# Patient Record
Sex: Male | Born: 1950 | Race: Black or African American | Hispanic: No | Marital: Married | Smoking: Former smoker
Health system: Southern US, Community
[De-identification: ages and names within clinical notes are randomized; demographics above are authoritative.]

## PROBLEM LIST (undated history)

## (undated) DIAGNOSIS — I2510 Atherosclerotic heart disease of native coronary artery without angina pectoris: Secondary | ICD-10-CM

## (undated) DIAGNOSIS — J45909 Unspecified asthma, uncomplicated: Secondary | ICD-10-CM

## (undated) DIAGNOSIS — I10 Essential (primary) hypertension: Secondary | ICD-10-CM

## (undated) DIAGNOSIS — C679 Malignant neoplasm of bladder, unspecified: Secondary | ICD-10-CM

## (undated) HISTORY — PX: FOOT SURGERY: SHX648

## (undated) HISTORY — PX: CARDIAC CATHETERIZATION: SHX172

## (undated) HISTORY — PX: BACK SURGERY: SHX140

## (undated) HISTORY — DX: Malignant neoplasm of bladder, unspecified: C67.9

## (undated) HISTORY — PX: TONSILLECTOMY: SUR1361

## (undated) HISTORY — PX: WRIST FRACTURE SURGERY: SHX121

---

## 1998-09-21 ENCOUNTER — Encounter: Payer: Self-pay | Admitting: Nephrology

## 1998-09-21 ENCOUNTER — Ambulatory Visit (HOSPITAL_COMMUNITY): Admission: RE | Admit: 1998-09-21 | Discharge: 1998-09-21 | Payer: Self-pay | Admitting: Nephrology

## 2003-07-11 ENCOUNTER — Encounter: Admission: RE | Admit: 2003-07-11 | Discharge: 2003-07-11 | Payer: Self-pay | Admitting: Allergy and Immunology

## 2005-02-18 IMAGING — CR DG CHEST 2V
2 series · 2 of 2 positions shown · non-contrast
Comparison: none

CLINICAL DATA: Cough, smoking history.  
 CHEST X-RAY
 Two views of the chest show the lungs to be clear.   The heart is within upper limits of normal.  No acute bony abnormality is seen. 
 IMPRESSION
 No active lung disease.

[view not recorded (1 of 2)]
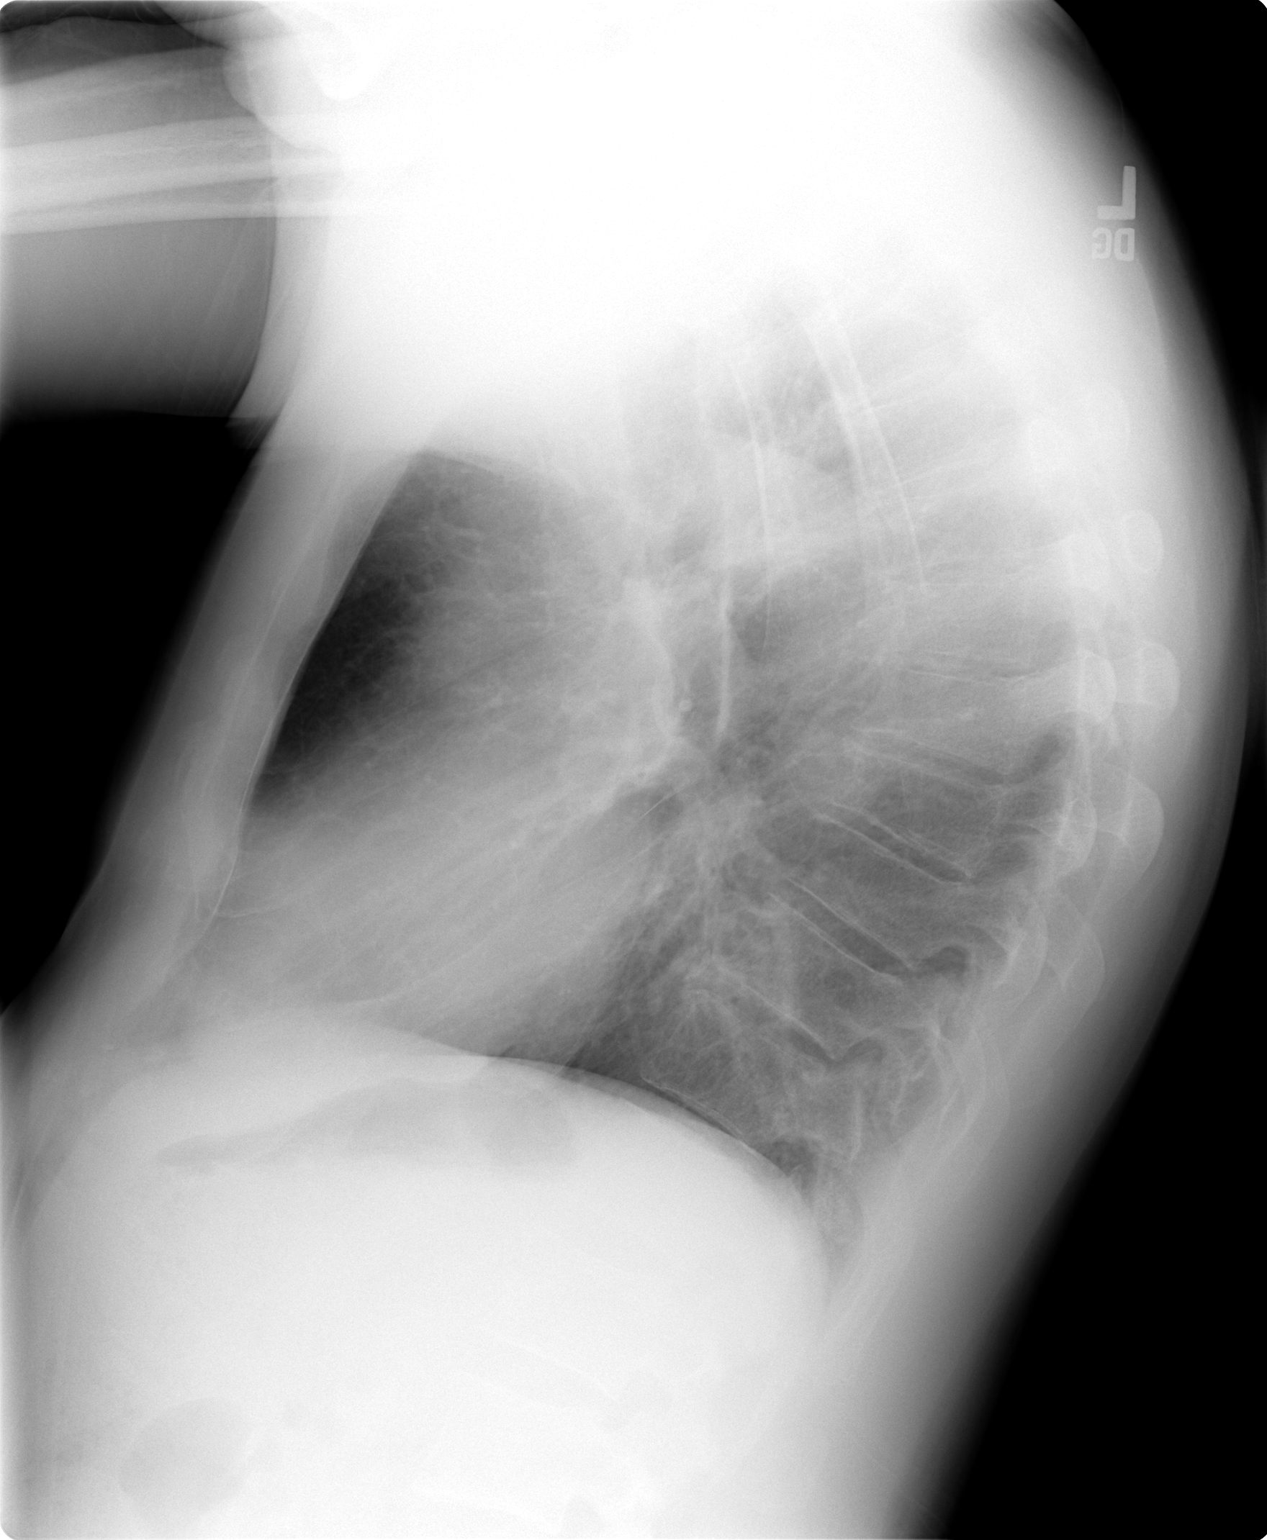

[view not recorded (2 of 2)]
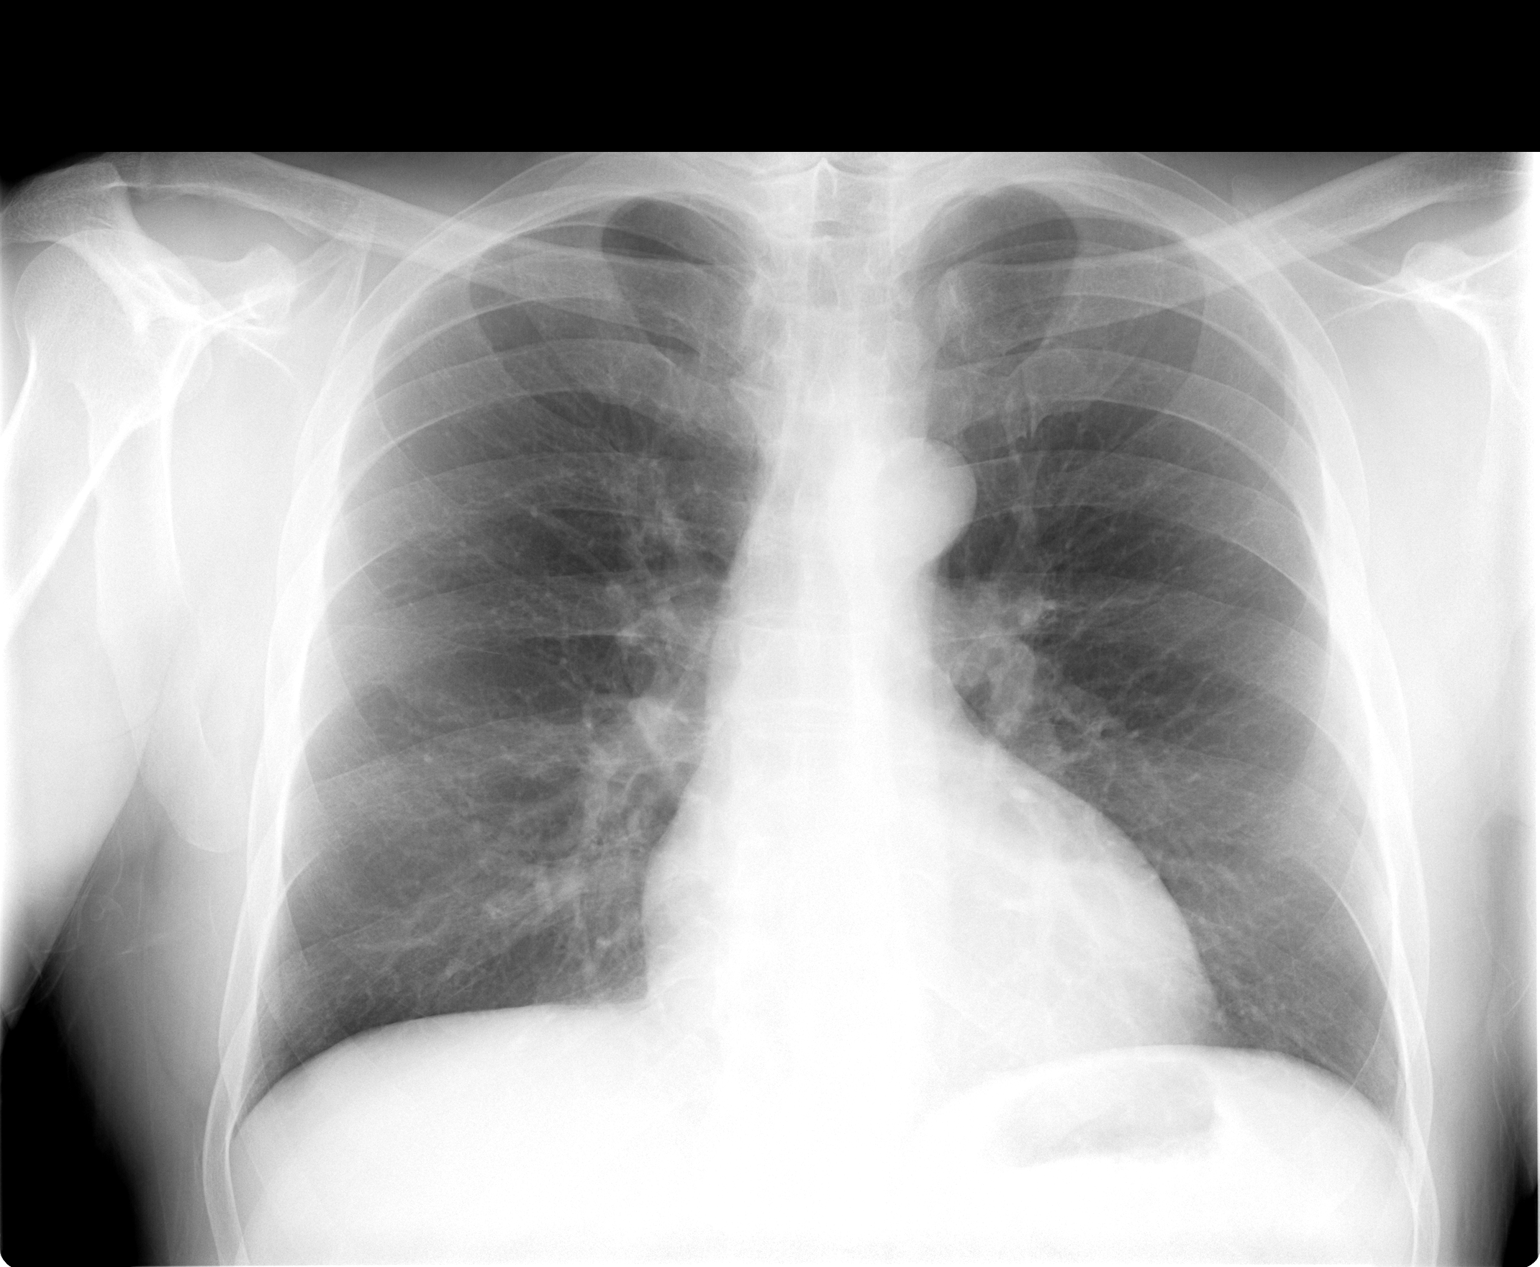

[2 of 2 positions shown; findings below may reference images not displayed]

## 2005-07-28 ENCOUNTER — Emergency Department (HOSPITAL_COMMUNITY): Admission: EM | Admit: 2005-07-28 | Discharge: 2005-07-28 | Payer: Self-pay | Admitting: Emergency Medicine

## 2007-07-30 ENCOUNTER — Emergency Department (HOSPITAL_COMMUNITY): Admission: EM | Admit: 2007-07-30 | Discharge: 2007-07-30 | Payer: Self-pay | Admitting: Emergency Medicine

## 2011-02-25 LAB — CBC
HCT: 44.8
Hemoglobin: 15.2
MCHC: 34
MCV: 85.8
Platelets: 230
RBC: 5.22
RDW: 12.7
WBC: 8.6

## 2011-02-25 LAB — URINALYSIS, ROUTINE W REFLEX MICROSCOPIC
Bilirubin Urine: NEGATIVE
Glucose, UA: NEGATIVE
Specific Gravity, Urine: 1.034 — ABNORMAL HIGH
pH: 5.5

## 2011-02-25 LAB — I-STAT 8, (EC8 V) (CONVERTED LAB)
Acid-Base Excess: 1
Bicarbonate: 26.9 — ABNORMAL HIGH
Glucose, Bld: 114 — ABNORMAL HIGH
Hemoglobin: 16.3
Operator id: 277751
Sodium: 138
TCO2: 28

## 2011-02-25 LAB — URINE MICROSCOPIC-ADD ON

## 2011-02-25 LAB — DIFFERENTIAL
Eosinophils Relative: 0
Lymphocytes Relative: 16
Monocytes Absolute: 0.6
Monocytes Relative: 7
Neutro Abs: 6.6

## 2014-05-06 ENCOUNTER — Emergency Department (HOSPITAL_COMMUNITY): Payer: Non-veteran care

## 2014-05-06 ENCOUNTER — Inpatient Hospital Stay (HOSPITAL_COMMUNITY)
Admission: EM | Admit: 2014-05-06 | Discharge: 2014-05-08 | DRG: 202 | Disposition: A | Discharge status: Home / Self Care | Payer: Non-veteran care | Attending: Family Medicine | Admitting: Family Medicine

## 2014-05-06 ENCOUNTER — Encounter (HOSPITAL_COMMUNITY): Payer: Self-pay | Admitting: Emergency Medicine

## 2014-05-06 DIAGNOSIS — J4540 Moderate persistent asthma, uncomplicated: Secondary | ICD-10-CM | POA: Diagnosis present

## 2014-05-06 DIAGNOSIS — R0609 Other forms of dyspnea: Secondary | ICD-10-CM | POA: Diagnosis present

## 2014-05-06 DIAGNOSIS — Z720 Tobacco use: Secondary | ICD-10-CM

## 2014-05-06 DIAGNOSIS — Z79899 Other long term (current) drug therapy: Secondary | ICD-10-CM

## 2014-05-06 DIAGNOSIS — I10 Essential (primary) hypertension: Secondary | ICD-10-CM | POA: Diagnosis present

## 2014-05-06 DIAGNOSIS — Z8249 Family history of ischemic heart disease and other diseases of the circulatory system: Secondary | ICD-10-CM

## 2014-05-06 DIAGNOSIS — I369 Nonrheumatic tricuspid valve disorder, unspecified: Secondary | ICD-10-CM

## 2014-05-06 DIAGNOSIS — R0602 Shortness of breath: Secondary | ICD-10-CM | POA: Diagnosis present

## 2014-05-06 DIAGNOSIS — I5033 Acute on chronic diastolic (congestive) heart failure: Secondary | ICD-10-CM | POA: Diagnosis present

## 2014-05-06 DIAGNOSIS — I5032 Chronic diastolic (congestive) heart failure: Secondary | ICD-10-CM

## 2014-05-06 DIAGNOSIS — R0600 Dyspnea, unspecified: Secondary | ICD-10-CM | POA: Diagnosis present

## 2014-05-06 DIAGNOSIS — J45901 Unspecified asthma with (acute) exacerbation: Principal | ICD-10-CM

## 2014-05-06 DIAGNOSIS — J209 Acute bronchitis, unspecified: Secondary | ICD-10-CM | POA: Diagnosis present

## 2014-05-06 DIAGNOSIS — R9431 Abnormal electrocardiogram [ECG] [EKG]: Secondary | ICD-10-CM | POA: Diagnosis present

## 2014-05-06 HISTORY — DX: Unspecified asthma, uncomplicated: J45.909

## 2014-05-06 LAB — CBC WITH DIFFERENTIAL/PLATELET
BASOS PCT: 1 % (ref 0–1)
Basophils Absolute: 0 10*3/uL (ref 0.0–0.1)
EOS ABS: 0 10*3/uL (ref 0.0–0.7)
Eosinophils Relative: 1 % (ref 0–5)
HCT: 45.2 % (ref 39.0–52.0)
HEMOGLOBIN: 15.5 g/dL (ref 13.0–17.0)
Lymphocytes Relative: 31 % (ref 12–46)
Lymphs Abs: 1.5 10*3/uL (ref 0.7–4.0)
MCH: 30.8 pg (ref 26.0–34.0)
MCHC: 34.3 g/dL (ref 30.0–36.0)
MCV: 89.7 fL (ref 78.0–100.0)
MONOS PCT: 11 % (ref 3–12)
Monocytes Absolute: 0.6 10*3/uL (ref 0.1–1.0)
NEUTROS ABS: 2.8 10*3/uL (ref 1.7–7.7)
NEUTROS PCT: 56 % (ref 43–77)
PLATELETS: 165 10*3/uL (ref 150–400)
RBC: 5.04 MIL/uL (ref 4.22–5.81)
RDW: 12.3 % (ref 11.5–15.5)
WBC: 5 10*3/uL (ref 4.0–10.5)

## 2014-05-06 LAB — BASIC METABOLIC PANEL
ANION GAP: 13 (ref 5–15)
BUN: 8 mg/dL (ref 6–23)
CHLORIDE: 100 meq/L (ref 96–112)
CO2: 24 mEq/L (ref 19–32)
Calcium: 9.5 mg/dL (ref 8.4–10.5)
Creatinine, Ser: 0.89 mg/dL (ref 0.50–1.35)
GFR, EST NON AFRICAN AMERICAN: 88 mL/min — AB (ref >90)
Glucose, Bld: 124 mg/dL — ABNORMAL HIGH (ref 70–99)
POTASSIUM: 3.9 meq/L (ref 3.7–5.3)
SODIUM: 137 meq/L (ref 137–147)

## 2014-05-06 LAB — CBC
HEMATOCRIT: 45 % (ref 39.0–52.0)
Hemoglobin: 15.5 g/dL (ref 13.0–17.0)
MCH: 30.2 pg (ref 26.0–34.0)
MCHC: 34.4 g/dL (ref 30.0–36.0)
MCV: 87.7 fL (ref 78.0–100.0)
PLATELETS: 172 10*3/uL (ref 150–400)
RBC: 5.13 MIL/uL (ref 4.22–5.81)
RDW: 12.1 % (ref 11.5–15.5)
WBC: 4.7 10*3/uL (ref 4.0–10.5)

## 2014-05-06 LAB — TROPONIN I: Troponin I: <0.3 ng/mL (ref <0.30)

## 2014-05-06 LAB — PRO B NATRIURETIC PEPTIDE: PRO B NATRI PEPTIDE: 51.2 pg/mL (ref 0–125)

## 2014-05-06 MED ORDER — IPRATROPIUM-ALBUTEROL 0.5-2.5 (3) MG/3ML IN SOLN
3.0000 mL | Freq: Three times a day (TID) | RESPIRATORY_TRACT | Status: DC
  Administered 2014-05-07: 3 mL via RESPIRATORY_TRACT
  Filled 2014-05-06: qty 3

## 2014-05-06 MED ORDER — HYDROCHLOROTHIAZIDE 25 MG PO TABS
25.0000 mg | ORAL_TABLET | Freq: Every day | ORAL | Status: DC
  Filled 2014-05-06: qty 1

## 2014-05-06 MED ORDER — HYDROCHLOROTHIAZIDE 12.5 MG PO CAPS
12.5000 mg | ORAL_CAPSULE | Freq: Once | ORAL | Status: AC
  Administered 2014-05-06: 12.5 mg via ORAL
  Filled 2014-05-06: qty 1

## 2014-05-06 MED ORDER — GUAIFENESIN ER 600 MG PO TB12
600.0000 mg | ORAL_TABLET | Freq: Two times a day (BID) | ORAL | Status: DC
  Administered 2014-05-06 (×2): 600 mg via ORAL
  Filled 2014-05-06 (×3): qty 1

## 2014-05-06 MED ORDER — ALBUTEROL SULFATE (2.5 MG/3ML) 0.083% IN NEBU: 2.5000 mg | INHALATION_SOLUTION | Freq: Four times a day (QID) | RESPIRATORY_TRACT | Status: DC

## 2014-05-06 MED ORDER — ONDANSETRON HCL 4 MG/2ML IJ SOLN: 4.0000 mg | Freq: Four times a day (QID) | INTRAMUSCULAR | Status: DC | PRN

## 2014-05-06 MED ORDER — ADULT MULTIVITAMIN W/MINERALS CH
1.0000 | ORAL_TABLET | Freq: Every day | ORAL | Status: DC
  Administered 2014-05-06 – 2014-05-08 (×3): 1 via ORAL
  Filled 2014-05-06 (×3): qty 1

## 2014-05-06 MED ORDER — SODIUM CHLORIDE 0.9 % IJ SOLN
3.0000 mL | Freq: Two times a day (BID) | INTRAMUSCULAR | Status: DC
  Administered 2014-05-06 – 2014-05-08 (×5): 3 mL via INTRAVENOUS

## 2014-05-06 MED ORDER — ONDANSETRON HCL 4 MG PO TABS: 4.0000 mg | ORAL_TABLET | Freq: Four times a day (QID) | ORAL | Status: DC | PRN

## 2014-05-06 MED ORDER — ALBUTEROL SULFATE (2.5 MG/3ML) 0.083% IN NEBU
2.5000 mg | INHALATION_SOLUTION | RESPIRATORY_TRACT | Status: DC | PRN
  Administered 2014-05-06: 2.5 mg via RESPIRATORY_TRACT
  Filled 2014-05-06: qty 3

## 2014-05-06 MED ORDER — ALIVE MENS ENERGY PO TABS: 1.0000 | ORAL_TABLET | Freq: Every day | ORAL | Status: DC

## 2014-05-06 MED ORDER — SODIUM CHLORIDE 0.9 % IV SOLN: 250.0000 mL | INTRAVENOUS | Status: DC | PRN

## 2014-05-06 MED ORDER — ACETAMINOPHEN 650 MG RE SUPP: 650.0000 mg | Freq: Four times a day (QID) | RECTAL | Status: DC | PRN

## 2014-05-06 MED ORDER — IPRATROPIUM BROMIDE 0.02 % IN SOLN: 0.5000 mg | Freq: Four times a day (QID) | RESPIRATORY_TRACT | Status: DC

## 2014-05-06 MED ORDER — ZOLPIDEM TARTRATE 5 MG PO TABS
5.0000 mg | ORAL_TABLET | Freq: Once | ORAL | Status: AC
  Administered 2014-05-06: 5 mg via ORAL
  Filled 2014-05-06: qty 1

## 2014-05-06 MED ORDER — SODIUM CHLORIDE 0.9 % IJ SOLN: 3.0000 mL | INTRAMUSCULAR | Status: DC | PRN

## 2014-05-06 MED ORDER — METOPROLOL TARTRATE 25 MG PO TABS
50.0000 mg | ORAL_TABLET | Freq: Once | ORAL | Status: AC
  Administered 2014-05-06: 50 mg via ORAL
  Filled 2014-05-06: qty 2

## 2014-05-06 MED ORDER — VITAMIN D3 25 MCG (1000 UNIT) PO TABS
1000.0000 [IU] | ORAL_TABLET | Freq: Every day | ORAL | Status: DC
  Administered 2014-05-06 – 2014-05-08 (×3): 1000 [IU] via ORAL
  Filled 2014-05-06 (×3): qty 1

## 2014-05-06 MED ORDER — METHYLPREDNISOLONE SODIUM SUCC 125 MG IJ SOLR
60.0000 mg | Freq: Two times a day (BID) | INTRAMUSCULAR | Status: DC
  Administered 2014-05-06 (×2): 60 mg via INTRAVENOUS
  Filled 2014-05-06 (×3): qty 0.96

## 2014-05-06 MED ORDER — ONDANSETRON HCL 4 MG/2ML IJ SOLN: 4.0000 mg | Freq: Three times a day (TID) | INTRAMUSCULAR | Status: DC | PRN

## 2014-05-06 MED ORDER — ACETAMINOPHEN 325 MG PO TABS: 650.0000 mg | ORAL_TABLET | Freq: Four times a day (QID) | ORAL | Status: DC | PRN

## 2014-05-06 MED ORDER — ASPIRIN EC 81 MG PO TBEC
81.0000 mg | DELAYED_RELEASE_TABLET | Freq: Every day | ORAL | Status: DC
  Administered 2014-05-06 – 2014-05-08 (×3): 81 mg via ORAL
  Filled 2014-05-06 (×3): qty 1

## 2014-05-06 MED ORDER — IPRATROPIUM-ALBUTEROL 0.5-2.5 (3) MG/3ML IN SOLN
3.0000 mL | Freq: Four times a day (QID) | RESPIRATORY_TRACT | Status: DC
  Administered 2014-05-06 (×2): 3 mL via RESPIRATORY_TRACT
  Filled 2014-05-06 (×2): qty 3

## 2014-05-06 MED ORDER — HYDROCODONE-ACETAMINOPHEN 5-325 MG PO TABS: 1.0000 | ORAL_TABLET | ORAL | Status: DC | PRN

## 2014-05-06 MED ORDER — ENOXAPARIN SODIUM 40 MG/0.4ML ~~LOC~~ SOLN
40.0000 mg | SUBCUTANEOUS | Status: DC
  Administered 2014-05-06 – 2014-05-07 (×2): 40 mg via SUBCUTANEOUS
  Filled 2014-05-06 (×3): qty 0.4

## 2014-05-06 NOTE — ED Provider Notes (Signed)
CSN: [id number]     Arrival date & time 05/06/14  0704 History   First MD Initiated Contact with Patient 05/06/14 [phone]     Chief Complaint  Patient presents with  . Shortness of Breath      HPI Pt states he has been having shortness of breath for the past few days that has progressively getting worse Pt states it is worse with exertion and when he lays flat Pt states he woke up this morning at 2 am and has not been able to go back to sleep  Past Medical History  Diagnosis Date  . Asthma    Past Surgical History  Procedure Laterality Date  . Back surgery     Family History  Problem Relation Age of Onset  . Diabetes Other   . Hypertension Other   . CAD Other    History  Substance Use Topics  . Smoking status: Light Tobacco Smoker    Types: Cigars  . Smokeless tobacco: Not on file  . Alcohol Use: Yes     Comment: socially    Review of Systems  All other systems reviewed and are negative.     Allergies  Review of patient's allergies indicates no known allergies.  Home Medications   Prior to Admission medications   Medication Sig Start Date End Date Taking? Authorizing Provider  albuterol (PROVENTIL HFA;VENTOLIN HFA) 108 (90 BASE) MCG/ACT inhaler Inhale 2 puffs into the lungs every 4 (four) hours as needed for wheezing or shortness of breath.   Yes Historical Provider, MD  cholecalciferol (VITAMIN D) 1000 UNITS tablet Take 1,000 Units by mouth daily.   Yes Historical Provider, MD  meloxicam (MOBIC) 7.5 MG tablet Take 7.5 mg by mouth daily as needed for pain.   Yes Historical Provider, MD  Multiple Vitamins-Minerals (ALIVE MENS ENERGY) TABS Take 1 tablet by mouth daily.   Yes Historical Provider, MD  guaiFENesin (MUCINEX) 600 MG 12 hr tablet Take 2 tablets (1,200 mg total) by mouth 2 (two) times daily. 05/08/14   Oswald Hillock, MD  hydrochlorothiazide (MICROZIDE) 12.5 MG capsule Take 1 capsule (12.5 mg total) by mouth daily. 05/08/14   Oswald Hillock, MD  levofloxacin  (LEVAQUIN) 500 MG tablet Take 1 tablet (500 mg total) by mouth daily. 05/08/14   Oswald Hillock, MD  metoprolol (LOPRESSOR) 50 MG tablet Take 1 tablet (50 mg total) by mouth 2 (two) times daily. 05/08/14   Oswald Hillock, MD  predniSONE (DELTASONE) 10 MG tablet Prednisone 40 mg po daily x 1 day then Prednisone 30 mg po daily x 1 day then Prednisone 20 mg po daily x 1 day then Prednisone 10 mg daily x 1 day then stop... 05/08/14   Oswald Hillock, MD   BP 136/80 mmHg  Pulse 63  Temp(Src) 97.9 F (36.6 C) (Oral)  Resp 20  Ht 6' (1.829 m)  Wt 194 lb 14.2 oz (88.4 kg)  BMI 26.43 kg/m2  SpO2 94% Physical Exam  Constitutional: He is oriented to person, place, and time. He appears well-developed and well-nourished. No distress.  HENT:  Head: Normocephalic and atraumatic.  Eyes: Pupils are equal, round, and reactive to light.  Neck: Normal range of motion.  Cardiovascular: Normal rate and intact distal pulses.   Pulmonary/Chest: He is in respiratory distress (Mild).  Abdominal: Normal appearance. He exhibits no distension.  Musculoskeletal: Normal range of motion.  Neurological: He is alert and oriented to person, place, and time. No cranial nerve deficit.  Skin: Skin is warm and dry. No rash noted.  Psychiatric: He has a normal mood and affect. His behavior is normal.  Nursing note and vitals reviewed.   ED Course  Procedures (including critical care time)  Medications  hydrochlorothiazide (MICROZIDE) capsule 12.5 mg (12.5 mg Oral Given 05/06/14 0929)  metoprolol tartrate (LOPRESSOR) tablet 50 mg (50 mg Oral Given 05/06/14 0928)  hydrochlorothiazide (MICROZIDE) capsule 12.5 mg (12.5 mg Oral Given 05/06/14 1121)  zolpidem (AMBIEN) tablet 5 mg (5 mg Oral Given 05/06/14 2055)  zolpidem (AMBIEN) tablet 5 mg (5 mg Oral Given 05/07/14 2236)    Labs Review Labs Reviewed  BASIC METABOLIC PANEL - Abnormal; Notable for the following:    Glucose, Bld 124 (*)    GFR calc non Af Amer 88 (*)    All other  components within normal limits  BASIC METABOLIC PANEL - Abnormal; Notable for the following:    Glucose, Bld 141 (*)    GFR calc non Af Amer 86 (*)    All other components within normal limits  BASIC METABOLIC PANEL - Abnormal; Notable for the following:    Glucose, Bld 137 (*)    BUN 25 (*)    GFR calc non Af Amer 73 (*)    GFR calc Af Amer 85 (*)    Anion gap 17 (*)    All other components within normal limits  CBC WITH DIFFERENTIAL  PRO B NATRIURETIC PEPTIDE  TROPONIN I  TROPONIN I  TROPONIN I  CBC  CBC    Imaging Review No results found.   EKG Interpretation   Date/Time:  Tuesday May 06 2014 07:21:00 EST Ventricular Rate:  74 PR Interval:  141 QRS Duration: 103 QT Interval:  393 QTC Calculation: 436 R Axis:   38 Text Interpretation:  Sinus rhythm Left atrial enlargement LVH with  secondary repolarization abnormality Anterior Q waves, possibly due to LVH  ST depr, consider ischemia, inferior leads No previous tracing Confirmed  by Yuleidy Rappleye  MD, [place] ([id number]) on 05/06/2014 7:24:29 AM      MDM   Final diagnoses:  SOB (shortness of breath)      Dot Lanes, MD 05/15/14 2155

## 2014-05-06 NOTE — Progress Notes (Signed)
Echocardiogram 2D Echocardiogram has been performed.  Alexander Burns 05/06/2014, 3:48 PM

## 2014-05-06 NOTE — Care Management Note (Addendum)
    Page 1 of 1   05/08/2014     4:09:48 PM CARE MANAGEMENT NOTE 05/08/2014  Patient:  KIANTE, CIAVARELLA   Account Number:  [id number]  Date Initiated:  05/06/2014  Documentation initiated by:  Dessa Phi  Subjective/Objective Assessment:   63 Y/O M ADMITTED W/SOB.     Action/Plan:   FROM HOME.   Anticipated DC Date:  05/08/2014   Anticipated DC Plan:  [place]  CM consult      Choice offered to / List presented to:             Status of service:  Completed, signed off Medicare Important Message given?   (If response is "NO", the following Medicare IM given date fields will be blank) Date Medicare IM given:   Medicare IM given by:   Date Additional Medicare IM given:   Additional Medicare IM given by:    Discharge Disposition:  HOME/SELF CARE  Per UR Regulation:  Reviewed for med. necessity/level of care/duration of stay  If discussed at [place] of Stay Meetings, dates discussed:    Comments:  05/08/14 Anyelin Mogle RN,BSN NCM [phone] D/C HOME NO [place].  05/06/14 Shlome Baldree RN,BSN NCM [place] D/C NEEDS.

## 2014-05-06 NOTE — H&P (Signed)
[facility] History and Physical  Lorene Samaan JQBHALP FXT:[id number] DOB: 08-Feb-1951 DOA: 05/06/2014  Referring physician: Dr Audie Pinto.  PCP: No primary care provider on file.   Chief Complaint: SOB  HPI: Alexander Burns is a 63 y.o. male with no PMH  Other than Asthma as a child, who presents with SOB , at rest and on exertion. He report dyspnea is worse at night, when he is lying down. He report dry cough. His symptoms started 3 days ago. Denies chest pain, fever, abdominal pain, diarrhea, nausea. He used to smoke cigarette, he quit 7 years ago.    Review of Systems:  Negative, except as per HPI.   History reviewed. No pertinent past medical history. Past Surgical History  Procedure Laterality Date  . Back surgery     Social History:  reports that he has been smoking Cigars.  He does not have any smokeless tobacco history on file. He reports that he drinks alcohol. He reports that he does not use illicit drugs.  No Known Allergies  Family History  Problem Relation Age of Onset  . Diabetes Other   . Hypertension Other   . CAD Other      Prior to Admission medications   Medication Sig Start Date End Date Taking? Authorizing Provider  albuterol (PROVENTIL HFA;VENTOLIN HFA) 108 (90 BASE) MCG/ACT inhaler Inhale 2 puffs into the lungs every 4 (four) hours as needed for wheezing or shortness of breath.   Yes Historical Provider, MD  cholecalciferol (VITAMIN D) 1000 UNITS tablet Take 1,000 Units by mouth daily.   Yes Historical Provider, MD  guaiFENesin (MUCINEX) 600 MG 12 hr tablet Take 600 mg by mouth daily.   Yes Historical Provider, MD  meloxicam (MOBIC) 7.5 MG tablet Take 7.5 mg by mouth daily as needed for pain.   Yes Historical Provider, MD  Multiple Vitamins-Minerals (ALIVE MENS ENERGY) TABS Take 1 tablet by mouth daily.   Yes Historical Provider, MD   Physical Exam: Filed Vitals:   05/06/14 0900 05/06/14 0928 05/06/14 0933 05/06/14 0949  BP: 149/75 149/75 174/93  157/117  Pulse: 62  66 64  Temp:    98.8 F (37.1 C)  TempSrc:    Oral  Resp:   22 22  Height:    6' (1.829 m)  Weight:    88.4 kg (194 lb 14.2 oz)  SpO2: 94%  97% 98%    Wt Readings from Last 3 Encounters:  05/06/14 88.4 kg (194 lb 14.2 oz)    General:  Appears calm and comfortable Eyes: PERRL, normal lids, irises & conjunctiva ENT: grossly normal hearing, lips & tongue Neck: no LAD, masses or thyromegaly Cardiovascular: RRR, no m/r/g. No LE edema. Telemetry: SR, no arrhythmias  Respiratory:Normal respiratory effort. Bilateral wheezing, ronchus.  Abdomen: soft, ntnd Skin: no rash or induration seen on limited exam Musculoskeletal: grossly normal tone BUE/BLE Psychiatric: grossly normal mood and affect, speech fluent and appropriate Neurologic: grossly non-focal.          Labs on Admission:  Basic Metabolic Panel:  Recent Labs Lab 05/06/14 0725  NA 137  K 3.9  CL 100  CO2 24  GLUCOSE 124*  BUN 8  CREATININE 0.89  CALCIUM 9.5   Liver Function Tests: No results for input(s): AST, ALT, ALKPHOS, BILITOT, PROT, ALBUMIN in the last 168 hours. No results for input(s): LIPASE, AMYLASE in the last 168 hours. No results for input(s): AMMONIA in the last 168 hours. CBC:  Recent Labs Lab 05/06/14 0725  WBC 5.0  NEUTROABS 2.8  HGB 15.5  HCT 45.2  MCV 89.7  PLT 165   Cardiac Enzymes:  Recent Labs Lab 05/06/14 0725  TROPONINI <0.30    BNP (last 3 results)  Recent Labs  05/06/14 0725  PROBNP 51.2   CBG: No results for input(s): GLUCAP in the last 168 hours.  Radiological Exams on Admission: Dg Chest 2 View  05/06/2014   CLINICAL DATA:  Shortness of breath, cough for 3 days  EXAM: CHEST  2 VIEW  COMPARISON:  07/28/2005  FINDINGS: Cardiomediastinal silhouette is stable. No acute infiltrate or pleural effusion. No pulmonary edema. Mild compression deformities mid thoracic spine of indeterminate age. Clinical correlation is necessary.  IMPRESSION: No  active cardiopulmonary disease. Mild compression deformities thoracic spine of indeterminate age.   Electronically Signed   By: Lahoma Crocker M.D.   On: 05/06/2014 08:08    EKG: Independently reviewed. T wave invention II, III, AVF, V5 V 6, no old EKG to compare.   Assessment/Plan Active Problems:   Shortness of breath   Asthma exacerbation   1-Dyspnea; Suspect this is secondary to asthma exacerbation bronchospasm. He has cough, wheezing on exam. Will also do evaluation for HF due to orthopnea, ECHO has been ordered. Will also rule out for ACS>  IV solumedrol, guaifenesin, Nebulizer treatments.  Cardiac enzymes.  ECHO.   2-HTN; new diagnosis. Will continue with HCTZ. PRN hydralazine PRN.   3-Abnormal EKG; cycle enzymes, ECHO.   Code Status: full code.  DVT Prophylaxis:lovenox Family Communication: Care discussed with wife. Disposition Plan: Expect less than 2 days inpatient.   Time spent: 75 minutes.   Niel Hummer A [facility] Pager [phone]

## 2014-05-06 NOTE — ED Notes (Signed)
Pt states he has been having shortness of breath for the past few days that has progressively getting worse  Pt states it is worse with exertion and when he lays flat  Pt states he woke up this morning at 2 am and has not been able to go back to sleep

## 2014-05-07 DIAGNOSIS — J209 Acute bronchitis, unspecified: Secondary | ICD-10-CM | POA: Diagnosis present

## 2014-05-07 DIAGNOSIS — Z720 Tobacco use: Secondary | ICD-10-CM | POA: Diagnosis not present

## 2014-05-07 DIAGNOSIS — J45901 Unspecified asthma with (acute) exacerbation: Secondary | ICD-10-CM | POA: Diagnosis not present

## 2014-05-07 DIAGNOSIS — I10 Essential (primary) hypertension: Secondary | ICD-10-CM | POA: Diagnosis present

## 2014-05-07 DIAGNOSIS — I5033 Acute on chronic diastolic (congestive) heart failure: Secondary | ICD-10-CM | POA: Diagnosis present

## 2014-05-07 DIAGNOSIS — Z79899 Other long term (current) drug therapy: Secondary | ICD-10-CM | POA: Diagnosis not present

## 2014-05-07 DIAGNOSIS — R0600 Dyspnea, unspecified: Secondary | ICD-10-CM | POA: Diagnosis present

## 2014-05-07 DIAGNOSIS — I5032 Chronic diastolic (congestive) heart failure: Secondary | ICD-10-CM

## 2014-05-07 DIAGNOSIS — Z8249 Family history of ischemic heart disease and other diseases of the circulatory system: Secondary | ICD-10-CM | POA: Diagnosis not present

## 2014-05-07 DIAGNOSIS — R9431 Abnormal electrocardiogram [ECG] [EKG]: Secondary | ICD-10-CM | POA: Diagnosis present

## 2014-05-07 LAB — CBC
HCT: 46 % (ref 39.0–52.0)
Hemoglobin: 15.9 g/dL (ref 13.0–17.0)
MCH: 30.6 pg (ref 26.0–34.0)
MCHC: 34.6 g/dL (ref 30.0–36.0)
MCV: 88.6 fL (ref 78.0–100.0)
PLATELETS: 200 10*3/uL (ref 150–400)
RBC: 5.19 MIL/uL (ref 4.22–5.81)
RDW: 12.1 % (ref 11.5–15.5)
WBC: 5.8 10*3/uL (ref 4.0–10.5)

## 2014-05-07 LAB — BASIC METABOLIC PANEL
Anion gap: 13 (ref 5–15)
BUN: 16 mg/dL (ref 6–23)
CO2: 25 mEq/L (ref 19–32)
CREATININE: 0.94 mg/dL (ref 0.50–1.35)
Calcium: 10.3 mg/dL (ref 8.4–10.5)
Chloride: 99 mEq/L (ref 96–112)
GFR, EST NON AFRICAN AMERICAN: 86 mL/min — AB (ref >90)
Glucose, Bld: 141 mg/dL — ABNORMAL HIGH (ref 70–99)
Potassium: 4.4 mEq/L (ref 3.7–5.3)
Sodium: 137 mEq/L (ref 137–147)

## 2014-05-07 MED ORDER — LEVOFLOXACIN IN D5W 500 MG/100ML IV SOLN
500.0000 mg | INTRAVENOUS | Status: DC
  Administered 2014-05-07: 500 mg via INTRAVENOUS
  Filled 2014-05-07 (×2): qty 100

## 2014-05-07 MED ORDER — HYDRALAZINE HCL 25 MG PO TABS
25.0000 mg | ORAL_TABLET | Freq: Four times a day (QID) | ORAL | Status: DC | PRN
  Administered 2014-05-07: 25 mg via ORAL
  Filled 2014-05-07 (×3): qty 1

## 2014-05-07 MED ORDER — GUAIFENESIN ER 600 MG PO TB12
1200.0000 mg | ORAL_TABLET | Freq: Two times a day (BID) | ORAL | Status: DC
  Administered 2014-05-07 – 2014-05-08 (×3): 1200 mg via ORAL
  Filled 2014-05-07 (×3): qty 2

## 2014-05-07 MED ORDER — METHYLPREDNISOLONE SODIUM SUCC 125 MG IJ SOLR
60.0000 mg | Freq: Four times a day (QID) | INTRAMUSCULAR | Status: DC
  Administered 2014-05-07 – 2014-05-08 (×5): 60 mg via INTRAVENOUS
  Filled 2014-05-07 (×6): qty 0.96

## 2014-05-07 MED ORDER — FUROSEMIDE 10 MG/ML IJ SOLN
20.0000 mg | Freq: Two times a day (BID) | INTRAMUSCULAR | Status: DC
  Administered 2014-05-07 (×2): 20 mg via INTRAVENOUS
  Filled 2014-05-07 (×2): qty 2

## 2014-05-07 MED ORDER — ZOLPIDEM TARTRATE 5 MG PO TABS
5.0000 mg | ORAL_TABLET | Freq: Once | ORAL | Status: AC
  Administered 2014-05-07: 5 mg via ORAL
  Filled 2014-05-07: qty 1

## 2014-05-07 MED ORDER — METOPROLOL TARTRATE 50 MG PO TABS
50.0000 mg | ORAL_TABLET | Freq: Two times a day (BID) | ORAL | Status: DC
  Administered 2014-05-07 – 2014-05-08 (×3): 50 mg via ORAL
  Filled 2014-05-07 (×3): qty 1

## 2014-05-07 MED ORDER — IPRATROPIUM-ALBUTEROL 0.5-2.5 (3) MG/3ML IN SOLN
3.0000 mL | Freq: Four times a day (QID) | RESPIRATORY_TRACT | Status: DC
  Administered 2014-05-07 – 2014-05-08 (×4): 3 mL via RESPIRATORY_TRACT
  Filled 2014-05-07 (×4): qty 3

## 2014-05-07 NOTE — Progress Notes (Signed)
UR completed 

## 2014-05-07 NOTE — Progress Notes (Signed)
[facility] PROGRESS NOTE  Alexander Burns QIW:[id number] DOB: [birth year] DOA: 05/06/2014 PCP: No primary care provider on file.  Assessment/Plan: 1. Acute bronchitis- continue Solu-Medrol 60 g IV every 6 hours, Mucinex 1200 mg twice a day, will start IV Levaquin 500 mg daily. Change DuoNeb nebulizers to every 6 hours. 2. Acute diastolic CHF- patient has significant LVH and grade 1 diastolic dysfunction on the echogram. Will start IV Lasix 20 g every 12 hours. 3. Hypertension- patient has uncontrolled hypertension, not treated will start metoprolol 50 mg twice a day. 4. DVT prophylaxis- Lovenox  Code Status: Full code Family Communication: Discussed with wife at bedside Disposition Plan: Home when stable   Consultants:  None  Procedures:  None  Antibiotics:  Levaquin  HPI/Subjective: 63 year old male with a history of asthma who came with shortness of breath. Patient also found to be hypertensive, echocardiogram shows grade 1 diastolic dysfunction. Today patient continues to have dyspnea on exertion, also coughing up phlegm.  Objective: Filed Vitals:   05/07/14 1543  BP: 157/82  Pulse:   Temp:   Resp:     Intake/Output Summary (Last 24 hours) at 05/07/14 1544 Last data filed at 05/07/14 1300  Gross per 24 hour  Intake    600 ml  Output      0 ml  Net    600 ml   Filed Weights   05/06/14 0713 05/06/14 0949  Weight: 89.359 kg (197 lb) 88.4 kg (194 lb 14.2 oz)    Exam:  Physical Exam: Eyes: No icterus, extraocular muscles intact  Lungs: Bilateral wheezing Heart: Regular rate and rhythm, S1 and S2 normal, no murmurs, rubs auscultated Abdomen: BS normoactive,soft,nondistended,non-tender to palpation,no organomegaly Extremities: No pretibial edema, no erythema, no cyanosis, no clubbing Neuro : Alert and oriented to time, place and person, No focal deficits   Data Reviewed: Basic Metabolic Panel:  Recent Labs Lab 05/06/14 0725 05/07/14 0433  NA  137 137  K 3.9 4.4  CL 100 99  CO2 24 25  GLUCOSE 124* 141*  BUN 8 16  CREATININE 0.89 0.94  CALCIUM 9.5 10.3   Liver Function Tests: No results for input(s): AST, ALT, ALKPHOS, BILITOT, PROT, ALBUMIN in the last 168 hours. No results for input(s): LIPASE, AMYLASE in the last 168 hours. No results for input(s): AMMONIA in the last 168 hours. CBC:  Recent Labs Lab 05/06/14 0725 05/06/14 1316 05/07/14 0433  WBC 5.0 4.7 5.8  NEUTROABS 2.8  --   --   HGB 15.5 15.5 15.9  HCT 45.2 45.0 46.0  MCV 89.7 87.7 88.6  PLT 165 172 200   Cardiac Enzymes:  Recent Labs Lab 05/06/14 0725 05/06/14 1318 05/06/14 1912  TROPONINI <0.30 <0.30 <0.30   BNP (last 3 results)  Recent Labs  05/06/14 0725  PROBNP 51.2   CBG: No results for input(s): GLUCAP in the last 168 hours.  No results found for this or any previous visit (from the past 240 hour(s)).   Studies: Dg Chest 2 View  05/06/2014   CLINICAL DATA:  Shortness of breath, cough for 3 days  EXAM: CHEST  2 VIEW  COMPARISON:  07/28/2005  FINDINGS: Cardiomediastinal silhouette is stable. No acute infiltrate or pleural effusion. No pulmonary edema. Mild compression deformities mid thoracic spine of indeterminate age. Clinical correlation is necessary.  IMPRESSION: No active cardiopulmonary disease. Mild compression deformities thoracic spine of indeterminate age.   Electronically Signed   By: Lahoma Crocker M.D.   On: 05/06/2014 08:08  Scheduled Meds: . aspirin EC  81 mg Oral Daily  . cholecalciferol  1,000 Units Oral Daily  . enoxaparin (LOVENOX) injection  40 mg Subcutaneous Q24H  . furosemide  20 mg Intravenous Q12H  . guaiFENesin  1,200 mg Oral BID  . ipratropium-albuterol  3 mL Nebulization Q6H  . levofloxacin (LEVAQUIN) IV  500 mg Intravenous Q24H  . methylPREDNISolone (SOLU-MEDROL) injection  60 mg Intravenous Q6H  . metoprolol tartrate  50 mg Oral BID  . multivitamin with minerals  1 tablet Oral Daily  . sodium chloride   3 mL Intravenous Q12H   Continuous Infusions:   Active Problems:   Shortness of breath   Asthma exacerbation    Time spent: 25 minutes    [facility] Pager [phone]. If 7PM-7AM, please contact night-coverage at [email or web address], password [facility] 05/07/2014, 3:44 PM  LOS: 1 day

## 2014-05-08 LAB — BASIC METABOLIC PANEL
Anion gap: 17 — ABNORMAL HIGH (ref 5–15)
BUN: 25 mg/dL — AB (ref 6–23)
CO2: 25 mEq/L (ref 19–32)
CREATININE: 1.05 mg/dL (ref 0.50–1.35)
Calcium: 10.4 mg/dL (ref 8.4–10.5)
Chloride: 98 mEq/L (ref 96–112)
GFR calc Af Amer: 85 mL/min — ABNORMAL LOW (ref >90)
GFR calc non Af Amer: 73 mL/min — ABNORMAL LOW (ref >90)
GLUCOSE: 137 mg/dL — AB (ref 70–99)
POTASSIUM: 4.7 meq/L (ref 3.7–5.3)
Sodium: 140 mEq/L (ref 137–147)

## 2014-05-08 MED ORDER — LEVOFLOXACIN 500 MG PO TABS: 500.0000 mg | ORAL_TABLET | Freq: Every day | ORAL | Status: DC

## 2014-05-08 MED ORDER — PREDNISONE 10 MG PO TABS: ORAL_TABLET | ORAL | Status: DC

## 2014-05-08 MED ORDER — HYDROCHLOROTHIAZIDE 12.5 MG PO CAPS
12.5000 mg | ORAL_CAPSULE | Freq: Every day | ORAL | Status: DC
  Administered 2014-05-08: 12.5 mg via ORAL
  Filled 2014-05-08: qty 1

## 2014-05-08 MED ORDER — HYDROCHLOROTHIAZIDE 12.5 MG PO CAPS: 12.5000 mg | ORAL_CAPSULE | Freq: Every day | ORAL | Status: DC

## 2014-05-08 MED ORDER — GUAIFENESIN ER 600 MG PO TB12: 1200.0000 mg | ORAL_TABLET | Freq: Two times a day (BID) | ORAL | Status: DC

## 2014-05-08 MED ORDER — METOPROLOL TARTRATE 50 MG PO TABS: 50.0000 mg | ORAL_TABLET | Freq: Two times a day (BID) | ORAL | Status: DC

## 2014-05-08 NOTE — Plan of Care (Signed)
Problem: Phase II Progression Outcomes Goal: O2 sats > equal to 90% on RA or at baseline Outcome: Completed/Met Date Met:  05/08/14 Goal: ADLs completed with minimal assistance Outcome: Completed/Met Date Met:  05/08/14 Goal: Activity at appropriate level-compared to baseline (UP IN CHAIR FOR HEMODIALYSIS)  Outcome: Completed/Met Date Met:  05/08/14 Goal: Tolerating diet Outcome: Completed/Met Date Met:  05/08/14 Goal: Discharge plan remains appropriate-arrangements made Outcome: Completed/Met Date Met:  05/08/14 Goal: Other Phase II Outcomes/Goals Outcome: Completed/Met Date Met:  05/08/14

## 2014-05-08 NOTE — Discharge Summary (Signed)
Physician Discharge Summary  Alexander Burns JOI:[id number] DOB: [birth year] DOA: 05/06/2014  PCP: No primary care provider on file.  Admit date: 05/06/2014 Discharge date: 05/08/2014  Time spent: *50 minutes  Recommendations for Outpatient Follow-up:  1. *Follow up PCP in 2 weeks  Discharge Diagnoses:  Active Problems:   Shortness of breath   Asthma exacerbation   Chronic diastolic CHF (congestive heart failure)   Discharge Condition: Stable  Diet recommendation: Low salt diet  Filed Weights   05/06/14 0713 05/06/14 0949  Weight: 89.359 kg (197 lb) 88.4 kg (194 lb 14.2 oz)    History of present illness:  63 y.o. male with no PMH Other than Asthma as a child, who presents with SOB , at rest and on exertion. He report dyspnea is worse at night, when he is lying down. He report dry cough. His symptoms started 3 days ago. Denies chest pain, fever, abdominal pain, diarrhea, nausea. He used to smoke cigarette, he quit 7 years ago.    Hospital Course:  1. Acute bronchitis- patient was started on  Solu-Medrol 60 g IV every 6 hours, Mucinex 1200 mg twice a day, started IV Levaquin 500 mg daily. Change DuoNeb nebulizers to every 6 hours. His breathing has now much improved. Will discharge home on Prednisone taper, po levaquin and mucinex. 2. Acute diastolic CHF- patient has significant LVH and grade 1 diastolic dysfunction on the echogram.  started IV Lasix 20 g every 12 hours. Will discharge home on Po HCTZ 12.5 mg po daily. 3. Hypertension- patient has uncontrolled hypertension, not treated was  started metoprolol 50 mg twice a day.BP well controlled, continue with metoprolol.  Procedures:  None  Consultations:  None  Discharge Exam: Filed Vitals:   05/08/14 0537  BP: 136/80  Pulse: 63  Temp: 97.9 F (36.6 C)  Resp:     General: Appear in no acute distress Cardiovascular: s1s2 RRR Respiratory: Clear bilaterally  Discharge Instructions You were cared for by a  hospitalist during your hospital stay. If you have any questions about your discharge medications or the care you received while you were in the hospital after you are discharged, you can call the unit and asked to speak with the hospitalist on call if the hospitalist that took care of you is not available. Once you are discharged, your primary care physician will handle any further medical issues. Please note that NO REFILLS for any discharge medications will be authorized once you are discharged, as it is imperative that you return to your primary care physician (or establish a relationship with a primary care physician if you do not have one) for your aftercare needs so that they can reassess your need for medications and monitor your lab values.  Discharge Instructions    Diet - low sodium heart healthy    Complete by:  As directed      Increase activity slowly    Complete by:  As directed           Current Discharge Medication List    START taking these medications   Details  hydrochlorothiazide (MICROZIDE) 12.5 MG capsule Take 1 capsule (12.5 mg total) by mouth daily. Qty: 30 capsule, Refills: 3    levofloxacin (LEVAQUIN) 500 MG tablet Take 1 tablet (500 mg total) by mouth daily. Qty: 7 tablet, Refills: 0    metoprolol (LOPRESSOR) 50 MG tablet Take 1 tablet (50 mg total) by mouth 2 (two) times daily. Qty: 60 tablet, Refills: 2    predniSONE (  DELTASONE) 10 MG tablet Prednisone 40 mg po daily x 1 day then Prednisone 30 mg po daily x 1 day then Prednisone 20 mg po daily x 1 day then Prednisone 10 mg daily x 1 day then stop... Qty: 10 tablet, Refills: 0      CONTINUE these medications which have CHANGED   Details  guaiFENesin (MUCINEX) 600 MG 12 hr tablet Take 2 tablets (1,200 mg total) by mouth 2 (two) times daily. Qty: 10 tablet, Refills: 0      CONTINUE these medications which have NOT CHANGED   Details  albuterol (PROVENTIL HFA;VENTOLIN HFA) 108 (90 BASE) MCG/ACT inhaler  Inhale 2 puffs into the lungs every 4 (four) hours as needed for wheezing or shortness of breath.    cholecalciferol (VITAMIN D) 1000 UNITS tablet Take 1,000 Units by mouth daily.    meloxicam (MOBIC) 7.5 MG tablet Take 7.5 mg by mouth daily as needed for pain.    Multiple Vitamins-Minerals (ALIVE MENS ENERGY) TABS Take 1 tablet by mouth daily.       No Known Allergies    The results of significant diagnostics from this hospitalization (including imaging, microbiology, ancillary and laboratory) are listed below for reference.    Significant Diagnostic Studies: Dg Chest 2 View  05/06/2014   CLINICAL DATA:  Shortness of breath, cough for 3 days  EXAM: CHEST  2 VIEW  COMPARISON:  07/28/2005  FINDINGS: Cardiomediastinal silhouette is stable. No acute infiltrate or pleural effusion. No pulmonary edema. Mild compression deformities mid thoracic spine of indeterminate age. Clinical correlation is necessary.  IMPRESSION: No active cardiopulmonary disease. Mild compression deformities thoracic spine of indeterminate age.   Electronically Signed   By: Lahoma Crocker M.D.   On: 05/06/2014 08:08    Microbiology: No results found for this or any previous visit (from the past 240 hour(s)).   Labs: Basic Metabolic Panel:  Recent Labs Lab 05/06/14 0725 05/07/14 0433 05/08/14 0450  NA 137 137 140  K 3.9 4.4 4.7  CL 100 99 98  CO2 24 25 25   GLUCOSE 124* 141* 137*  BUN 8 16 25*  CREATININE 0.89 0.94 1.05  CALCIUM 9.5 10.3 10.4   Liver Function Tests: No results for input(s): AST, ALT, ALKPHOS, BILITOT, PROT, ALBUMIN in the last 168 hours. No results for input(s): LIPASE, AMYLASE in the last 168 hours. No results for input(s): AMMONIA in the last 168 hours. CBC:  Recent Labs Lab 05/06/14 0725 05/06/14 1316 05/07/14 0433  WBC 5.0 4.7 5.8  NEUTROABS 2.8  --   --   HGB 15.5 15.5 15.9  HCT 45.2 45.0 46.0  MCV 89.7 87.7 88.6  PLT 165 172 200   Cardiac Enzymes:  Recent Labs Lab  05/06/14 0725 05/06/14 1318 05/06/14 1912  TROPONINI <0.30 <0.30 <0.30   BNP: BNP (last 3 results)  Recent Labs  05/06/14 0725  PROBNP 51.2   CBG: No results for input(s): GLUCAP in the last 168 hours.     SignedEleonore Chiquito S  [facility] 05/08/2014, 9:59 AM

## 2015-03-06 ENCOUNTER — Other Ambulatory Visit: Payer: Self-pay | Admitting: Internal Medicine

## 2015-03-06 DIAGNOSIS — M545 Low back pain: Secondary | ICD-10-CM

## 2015-03-20 ENCOUNTER — Other Ambulatory Visit: Payer: Non-veteran care

## 2015-03-24 ENCOUNTER — Ambulatory Visit
Admission: RE | Admit: 2015-03-24 | Discharge: 2015-03-24 | Disposition: A | Discharge status: Home / Self Care | Payer: Non-veteran care | Source: Ambulatory Visit | Attending: Internal Medicine | Admitting: Internal Medicine

## 2015-03-24 DIAGNOSIS — M545 Low back pain: Secondary | ICD-10-CM

## 2015-05-27 ENCOUNTER — Ambulatory Visit: Payer: Self-pay | Admitting: Allergy and Immunology

## 2016-06-06 DIAGNOSIS — C679 Malignant neoplasm of bladder, unspecified: Secondary | ICD-10-CM

## 2016-06-06 HISTORY — DX: Malignant neoplasm of bladder, unspecified: C67.9

## 2016-08-10 ENCOUNTER — Emergency Department (HOSPITAL_COMMUNITY)
Admission: EM | Admit: 2016-08-10 | Discharge: 2016-08-10 | Disposition: A | Discharge status: Home / Self Care | Payer: Non-veteran care | Attending: Emergency Medicine | Admitting: Emergency Medicine

## 2016-08-10 ENCOUNTER — Emergency Department (HOSPITAL_COMMUNITY): Payer: Non-veteran care

## 2016-08-10 ENCOUNTER — Encounter (HOSPITAL_COMMUNITY): Payer: Self-pay

## 2016-08-10 DIAGNOSIS — I110 Hypertensive heart disease with heart failure: Secondary | ICD-10-CM | POA: Insufficient documentation

## 2016-08-10 DIAGNOSIS — R319 Hematuria, unspecified: Secondary | ICD-10-CM | POA: Insufficient documentation

## 2016-08-10 DIAGNOSIS — M549 Dorsalgia, unspecified: Secondary | ICD-10-CM | POA: Insufficient documentation

## 2016-08-10 DIAGNOSIS — J45909 Unspecified asthma, uncomplicated: Secondary | ICD-10-CM | POA: Insufficient documentation

## 2016-08-10 DIAGNOSIS — Z79899 Other long term (current) drug therapy: Secondary | ICD-10-CM | POA: Diagnosis not present

## 2016-08-10 DIAGNOSIS — I5032 Chronic diastolic (congestive) heart failure: Secondary | ICD-10-CM | POA: Diagnosis not present

## 2016-08-10 DIAGNOSIS — F17290 Nicotine dependence, other tobacco product, uncomplicated: Secondary | ICD-10-CM | POA: Insufficient documentation

## 2016-08-10 HISTORY — DX: Essential (primary) hypertension: I10

## 2016-08-10 LAB — URINALYSIS, ROUTINE W REFLEX MICROSCOPIC
Bilirubin Urine: NEGATIVE
Glucose, UA: NEGATIVE mg/dL
KETONES UR: NEGATIVE mg/dL
Leukocytes, UA: NEGATIVE
Nitrite: NEGATIVE
PH: 7 (ref 5.0–8.0)
Protein, ur: NEGATIVE mg/dL
Specific Gravity, Urine: 1.013 (ref 1.005–1.030)
Squamous Epithelial / LPF: NONE SEEN

## 2016-08-10 MED ORDER — PHENAZOPYRIDINE HCL 200 MG PO TABS: 200.0000 mg | ORAL_TABLET | Freq: Three times a day (TID) | ORAL | 0 refills | Status: DC

## 2016-08-10 MED ORDER — KETOROLAC TROMETHAMINE 30 MG/ML IJ SOLN
15.0000 mg | Freq: Once | INTRAMUSCULAR | Status: AC
  Administered 2016-08-10: 15 mg via INTRAMUSCULAR
  Filled 2016-08-10: qty 1

## 2016-08-10 NOTE — ED Notes (Signed)
Patient was alert, oriented and stable upon discharge. RN went over AVS and patient had no further questions.  

## 2016-08-10 NOTE — ED Provider Notes (Signed)
[facility] DEPT Provider Note   CSN: [id number] Arrival date & time: 08/10/16  1357     History   Chief Complaint Chief Complaint  Patient presents with  . Hematuria    HPI Alexander Burns is a 66 y.o. male.  HPI  Patient presents with her hematuria, back pain. Onset was 3 days ago.  Since onset patient has had persistent symptoms, with soreness throughout the low back, midline. Pain is nonradiating, no lower extremity weakness, no incontinence, no difficulty voiding. Patient is unsure if he has history of kidney stone. No medication taken for relief. No fever, no chills, no nausea, vomiting, diarrhea.  Past Medical History:  Diagnosis Date  . Asthma   . Hypertension     Patient Active Problem List   Diagnosis Date Noted  . Chronic diastolic CHF (congestive heart failure) ([place]) 05/07/2014  . Shortness of breath 05/06/2014  . Asthma exacerbation 05/06/2014    Past Surgical History:  Procedure Laterality Date  . BACK SURGERY    . FOOT SURGERY    . TONSILLECTOMY         Home Medications    Prior to Admission medications   Medication Sig Start Date End Date Taking? Authorizing Provider  albuterol (PROVENTIL HFA;VENTOLIN HFA) 108 (90 BASE) MCG/ACT inhaler Inhale 2 puffs into the lungs every 4 (four) hours as needed for wheezing or shortness of breath.   Yes Historical Provider, MD  cholecalciferol (VITAMIN D) 1000 UNITS tablet Take 1,000 Units by mouth every morning.    Yes Historical Provider, MD  hydrochlorothiazide (MICROZIDE) 12.5 MG capsule Take 1 capsule (12.5 mg total) by mouth daily. Patient taking differently: Take 12.5 mg by mouth every morning.  05/08/14  Yes Oswald Hillock, MD  losartan (COZAAR) 100 MG tablet Take 50 mg by mouth every morning.    Yes Historical Provider, MD  meloxicam (MOBIC) 7.5 MG tablet Take 7.5 mg by mouth daily as needed for pain.   Yes Historical Provider, MD  metoprolol (LOPRESSOR) 50 MG tablet Take 12.5 mg by mouth 2 (two)  times daily. 05/08/14  Yes Historical Provider, MD  OVER THE COUNTER MEDICATION Take 2 tablets by mouth daily at 12 noon.   Yes Historical Provider, MD  metoprolol (LOPRESSOR) 50 MG tablet Take 1 tablet (50 mg total) by mouth 2 (two) times daily. Patient not taking: Reported on 08/10/2016 05/08/14   Oswald Hillock, MD    Family History Family History  Problem Relation Age of Onset  . Diabetes Other   . Hypertension Other   . CAD Other     Social History Social History  Substance Use Topics  . Smoking status: Light Tobacco Smoker    Types: Cigars  . Smokeless tobacco: Never Used  . Alcohol use Yes     Comment: socially     Allergies   Patient has no known allergies.   Review of Systems Review of Systems  Constitutional:       Per HPI, otherwise negative  HENT:       Per HPI, otherwise negative  Respiratory:       Per HPI, otherwise negative  Cardiovascular:       Per HPI, otherwise negative  Gastrointestinal: Negative for vomiting.  Endocrine:       Negative aside from HPI  Genitourinary:       Neg aside from HPI   Musculoskeletal:       Per HPI, otherwise negative  Skin: Negative.   Allergic/Immunologic: Negative for  immunocompromised state.  Neurological: Negative for syncope.     Physical Exam Updated Vital Signs BP 155/78 (BP Location: Right Arm)   Pulse 61   Temp 97.6 F (36.4 C) (Oral)   Resp 18   Ht 6' (1.829 m)   Wt 219 lb (99.3 kg)   SpO2 99%   BMI 29.70 kg/m   Physical Exam  Constitutional: He is oriented to person, place, and time. He appears well-developed. No distress.  HENT:  Head: Normocephalic and atraumatic.  Eyes: Conjunctivae and EOM are normal.  Cardiovascular: Normal rate and regular rhythm.   Pulmonary/Chest: Effort normal. No stridor. No respiratory distress.  Abdominal: He exhibits no distension. There is no tenderness.  Musculoskeletal: He exhibits no edema.  Neurological: He is alert and oriented to person, place, and time.    Skin: Skin is warm and dry.  Psychiatric: He has a normal mood and affect.  Nursing note and vitals reviewed.    ED Treatments / Results  Labs (all labs ordered are listed, but only abnormal results are displayed) Labs Reviewed  URINALYSIS, ROUTINE W REFLEX MICROSCOPIC - Abnormal; Notable for the following:       Result Value   Hgb urine dipstick SMALL (*)    Bacteria, UA RARE (*)    All other components within normal limits     Radiology Ct Renal Stone Study  Result Date: 08/10/2016 CLINICAL DATA:  Hematuria and back pain. EXAM: CT ABDOMEN AND PELVIS WITHOUT CONTRAST TECHNIQUE: Multidetector CT imaging of the abdomen and pelvis was performed following the standard protocol without IV contrast. COMPARISON:  CT scan dated 07/30/2007 FINDINGS: Lower chest: Normal. Hepatobiliary: No focal liver abnormality is seen. No gallstones, gallbladder wall thickening, or biliary dilatation. Pancreas: Unremarkable. No pancreatic ductal dilatation or surrounding inflammatory changes. Spleen: Normal in size without focal abnormality. Adrenals/Urinary Tract: Chronic calcification in the right adrenal gland. Left adrenal gland is normal. 8 mm low-density area in the upper pole of the right kidney. Fifteen and 12 mm low-density lesions in the left mid kidney. These are most likely cysts. The lesions on the left were present on the prior study. No hydronephrosis. No renal or ureteral calculi. The bladder is normal. Stomach/Bowel: Scattered diverticula in the descending colon. The bowel is otherwise normal. Vascular/Lymphatic: Atherosclerosis of the iliac arteries. No adenopathy. Reproductive: Prostate is unremarkable. Other: No abdominal wall hernia or abnormality. No abdominopelvic ascites. Musculoskeletal: No acute or significant osseous findings. IMPRESSION: No acute abnormalities.  No renal or ureteral or bladder calculi. Diverticulosis of the left side of the colon. Electronically Signed   By: Lorriane Shire  M.D.   On: 08/10/2016 15:09    Procedures Procedures (including critical care time)  Medications Ordered in ED Medications  ketorolac (TORADOL) 30 MG/ML injection 15 mg (15 mg Intramuscular Given 08/10/16 1515)     Initial Impression / Assessment and Plan / ED Course  I have reviewed the triage vital signs and the nursing notes.  Pertinent labs & imaging results that were available during my care of the patient were reviewed by me and considered in my medical decision making (see chart for details).  On repeat exam the patient is awake and alert, in no distress. I discussed all findings with him, including hematuria, reassuring CT scan. We discussed possibilities of cystitis versus recently passed kidney stone. Patient has not seen a urologist in a long time. No fever, no evidence for obstruction, infection, no evidence for distress, patient will be started on medication for  symptomatically relief, follow-up with urology.   Final Clinical Impressions(s) / ED Diagnoses  Hematuria Back pain    Carmin Muskrat, MD 08/10/16 1558

## 2016-08-10 NOTE — Discharge Instructions (Signed)
As discussed, your evaluation today has been largely reassuring.  But, it is important that you monitor your condition carefully, and do not hesitate to return to the ED if you develop new, or concerning changes in your condition. ? ?Otherwise, please follow-up with your physician for appropriate ongoing care. ? ?

## 2016-08-10 NOTE — ED Triage Notes (Signed)
Pt with flank pain on Monday.  Tuesday noted blood in urine.  Pain has increased.  Pt urinating normally with no retention.  No noted odor.  No thinners.

## 2017-10-17 ENCOUNTER — Encounter: Payer: Self-pay | Admitting: Allergy and Immunology

## 2017-10-17 ENCOUNTER — Ambulatory Visit: Payer: Medicare Other | Admitting: Allergy and Immunology

## 2017-10-17 VITALS — BP 158/86 | HR 68 | Temp 98.6°F | Resp 20 | Ht 72.0 in | Wt 221.4 lb

## 2017-10-17 DIAGNOSIS — K219 Gastro-esophageal reflux disease without esophagitis: Secondary | ICD-10-CM | POA: Diagnosis not present

## 2017-10-17 DIAGNOSIS — J3089 Other allergic rhinitis: Secondary | ICD-10-CM

## 2017-10-17 DIAGNOSIS — J4540 Moderate persistent asthma, uncomplicated: Secondary | ICD-10-CM | POA: Diagnosis not present

## 2017-10-17 MED ORDER — MONTELUKAST SODIUM 10 MG PO TABS: 10.0000 mg | ORAL_TABLET | Freq: Every day | ORAL | 5 refills | Status: DC

## 2017-10-17 MED ORDER — OMEPRAZOLE 40 MG PO CPDR: 40.0000 mg | DELAYED_RELEASE_CAPSULE | Freq: Every day | ORAL | 5 refills | Status: DC

## 2017-10-17 MED ORDER — FLUTICASONE FUROATE-VILANTEROL 200-25 MCG/INH IN AEPB: 1.0000 | INHALATION_SPRAY | Freq: Every day | RESPIRATORY_TRACT | 3 refills | Status: DC

## 2017-10-17 NOTE — Progress Notes (Signed)
NEW PATIENT NOTE  Referring Provider: No ref. provider found Primary Provider: No primary care provider on file. Date of office visit: 10/17/2017    Subjective:   Chief Complaint:  Alexander Burns (DOB: 09/26/1950) is a 67 y.o. male who presents to the clinic on 10/17/2017 with a chief complaint of Asthma and sinus trouble .  HPI: Shrey presents to this clinic in evaluation of breathing issues.  Apparently I have seen him in this clinic 10 years ago.  He has a history of asthma for many decades requiring hospitalization 4 years ago at [facility] and presently requiring the administration of a short acting bronchodilator on a daily basis.  He gets very dyspneic when he exerts himself along with some coughing and wheezing.  Apparently this does not disturb his sleep.  It does not sound as though he is on any controller agent at this point in time although in the past he has been on multiple medications.  He believes that the last steroid administration that he had for this issue was in 2018.  In addition, he has nasal congestion and stuffiness especially during the spring and fall and especially with outdoor exposure for which he is using nasal fluticasone intermittently.  He also has issues with throat clearing and postnasal drip.  He does have heartburn in his epigastric region for which he will take Tums.  He drinks 1 coffee per day and does not really consume much alcohol or chocolate.  He did smoke intermittently for many decades but he has not used any cigarettes in greater than 10 years.  Past Medical History:  Diagnosis Date  . Asthma   . Bladder cancer ([place]) 2018  . Hypertension     Past Surgical History:  Procedure Laterality Date  . BACK SURGERY    . FOOT SURGERY    . TONSILLECTOMY      Allergies as of 10/17/2017   No Known Allergies     Medication List      albuterol 108 (90 Base) MCG/ACT inhaler Commonly known as:  PROVENTIL HFA;VENTOLIN HFA Inhale 2  puffs into the lungs every 4 (four) hours as needed for wheezing or shortness of breath.   cholecalciferol 1000 units tablet Commonly known as:  VITAMIN D Take 1,000 Units by mouth every morning.   fluticasone 50 MCG/ACT nasal spray Commonly known as:  FLONASE Place 1 spray into both nostrils 2 (two) times daily as needed for allergies or rhinitis.   losartan 100 MG tablet Commonly known as:  COZAAR Take 50 mg by mouth every morning.   meloxicam 7.5 MG tablet Commonly known as:  MOBIC Take 7.5 mg by mouth daily as needed for pain.   OVER THE COUNTER MEDICATION Take 2 tablets by mouth daily at 12 noon.       Review of systems negative except as noted in HPI / PMHx or noted below:  Review of Systems  Constitutional: Negative.   HENT: Negative.   Eyes: Negative.   Respiratory: Negative.   Cardiovascular: Negative.   Gastrointestinal: Negative.   Genitourinary: Negative.   Musculoskeletal: Negative.   Skin: Negative.   Neurological: Negative.   Endo/Heme/Allergies: Negative.   Psychiatric/Behavioral: Negative.     Family History  Problem Relation Age of Onset  . Diabetes Other   . Hypertension Other   . CAD Other   . Diabetes Mother   . Diabetes Father   . Diabetes Sister   . Breast cancer Sister   .  Asthma Maternal Grandmother     Social History   Socioeconomic History  . Marital status: Married    Spouse name: Not on file  . Number of children: Not on file  . Years of education: Not on file  . Highest education level: Not on file  Occupational History  . Not on file  Social Needs  . Financial resource strain: Not on file  . Food insecurity:    Worry: Not on file    Inability: Not on file  . Transportation needs:    Medical: Not on file    Non-medical: Not on file  Tobacco Use  . Smoking status: Former Smoker    Packs/day: 1.00    Years: 3.00    Pack years: 3.00    Types: Cigars, Cigarettes    Last attempt to quit: 2009    Years since  quitting: 10.3  . Smokeless tobacco: Never Used  Substance and Sexual Activity  . Alcohol use: Yes    Comment: socially  . Drug use: No  . Sexual activity: Not on file  Lifestyle  . Physical activity:    Days per week: Not on file    Minutes per session: Not on file  . Stress: Not on file  Relationships  . Social connections:    Talks on phone: Not on file    Gets together: Not on file    Attends religious service: Not on file    Active member of club or organization: Not on file    Attends meetings of clubs or organizations: Not on file    Relationship status: Not on file  . Intimate partner violence:    Fear of current or ex partner: Not on file    Emotionally abused: Not on file    Physically abused: Not on file    Forced sexual activity: Not on file  Other Topics Concern  . Not on file  Social History Narrative  . Not on file    Environmental and Social history  Lives in a house with a dry environment, no animals located inside the household, carpet in the bedroom, no plastic on the bed, no plastic on the pillow, and no smoking ongoing with inside the household.  Objective:   Vitals:   10/17/17 1357  BP: (!) 158/86  Pulse: 68  Resp: 20  Temp: 98.6 F (37 C)  SpO2: 96%   Height: 6' (182.9 cm) Weight: 221 lb 6.4 oz (100.4 kg)  Physical Exam  HENT:  Head: Normocephalic. Head is without right periorbital erythema and without left periorbital erythema.  Right Ear: Tympanic membrane, external ear and ear canal normal.  Left Ear: Tympanic membrane, external ear and ear canal normal.  Nose: Mucosal edema present. No rhinorrhea.  Mouth/Throat: Oropharynx is clear and moist and mucous membranes are normal. No oropharyngeal exudate.  Eyes: Pupils are equal, round, and reactive to light. Conjunctivae and lids are normal.  Neck: Trachea normal. No tracheal deviation present. No thyromegaly present.  Cardiovascular: Normal rate, regular rhythm, S1 normal, S2 normal and  normal heart sounds.  No murmur heard. Pulmonary/Chest: Effort normal. No stridor. No respiratory distress. He has no wheezes. He has no rales. He exhibits no tenderness.  Abdominal: Soft. He exhibits no distension and no mass. There is no hepatosplenomegaly. There is no tenderness. There is no rebound and no guarding.  Musculoskeletal: He exhibits no edema or tenderness.  Lymphadenopathy:       Head (right side): No tonsillar adenopathy present.  Head (left side): No tonsillar adenopathy present.    He has no cervical adenopathy.    He has no axillary adenopathy.  Neurological: He is alert.  Skin: No rash noted. He is not diaphoretic. No erythema. No pallor. Nails show no clubbing.    Diagnostics: Allergy skin tests were performed.  He demonstrated hypersensitivity to house dust mite and ragweed.  Spirometry was performed and demonstrated an FEV1 of 1.95 @ 65 % of predicted. FEV1/FVC = 0.63.  Following the administration of nebulized albuterol his FEV1 rose to 2.22 which was an increase in the FEV1 of 14%.  Assessment and Plan:    1. Not well controlled moderate persistent asthma   2. Other allergic rhinitis   3. LPRD (laryngopharyngeal reflux disease)     1.  Allergen avoidance measures  2.  Treat and prevent inflammation:   A.  Breo 200 -1 inhalation 1 time per day  B.  Fluticasone -1-2 sprays each nostril 1 time per day  C.  Montelukast 10 mg -1 tablet 1 time per day  3.  Treat and prevent reflux:   A.  Attempt to consolidate all caffeine consumption as best as possible  B.  Omeprazole 40 mg tablet 1 time per day  4.  If needed:   A.  Nasal saline spray  B.  OTC antihistamine  C.  Ventolin HFA or similar 2 inhalations every 4-6 hours  5.  Consider a course of immunotherapy  6.  Return to clinic in 4 weeks or earlier if problem  Kristian has atopic respiratory disease with asthma and allergic rhinitis that hopefully will respond to a combination of allergen  avoidance measures and the use of anti-inflammatory agents for his respiratory tract on a consistent basis.  As well he has a history very consistent with LPR and we will treat him with a proton pump inhibitor as noted above.  He would definitely be a candidate for immunotherapy if he fails medical treatment.  I will regroup with him in 4 weeks or earlier if there is a problem.  Allena Katz, MD Allergy / Immunology [place]

## 2017-10-17 NOTE — Patient Instructions (Addendum)
  1.  Allergen avoidance measures  2.  Treat and prevent inflammation:   A.  Breo 200 -1 inhalation 1 time per day  B.  Fluticasone -1-2 sprays each nostril 1 time per day  C.  Montelukast 10 mg -1 tablet 1 time per day  3.  Treat and prevent reflux:   A.  Attempt to consolidate all caffeine consumption as best as possible  B.  Omeprazole 40 mg tablet 1 time per day  4.  If needed:   A.  Nasal saline spray  B.  OTC antihistamine  C.  Ventolin HFA or similar 2 inhalations every 4-6 hours  5.  Consider a course of immunotherapy  6.  Return to clinic in 4 weeks or earlier if problem

## 2017-10-18 ENCOUNTER — Encounter: Payer: Self-pay | Admitting: Allergy and Immunology

## 2017-11-14 ENCOUNTER — Encounter: Payer: Self-pay | Admitting: Allergy and Immunology

## 2017-11-14 ENCOUNTER — Ambulatory Visit: Payer: Medicare Other | Admitting: Allergy and Immunology

## 2017-11-14 VITALS — BP 118/72 | HR 74 | Resp 18

## 2017-11-14 DIAGNOSIS — J3089 Other allergic rhinitis: Secondary | ICD-10-CM | POA: Insufficient documentation

## 2017-11-14 DIAGNOSIS — K219 Gastro-esophageal reflux disease without esophagitis: Secondary | ICD-10-CM | POA: Diagnosis not present

## 2017-11-14 DIAGNOSIS — J4540 Moderate persistent asthma, uncomplicated: Secondary | ICD-10-CM | POA: Diagnosis not present

## 2017-11-14 NOTE — Assessment & Plan Note (Signed)
   Continue appropriate reflux lifestyle modifications and omeprazole as prescribed. 

## 2017-11-14 NOTE — Progress Notes (Signed)
Follow-up Note  RE: Alexander Burns MRN: [id number] DOB: 09/03/1950 Date of Office Visit: 11/14/2017  Primary care provider: No primary care provider on file. Referring provider: No ref. provider found  History of present illness: Alexander Burns is a 67 y.o. male with persistent asthma, allergic rhinitis, and laryngopharyngeal reflux presenting today for follow-up.  He was previously seen in this clinic for his initial evaluation on Oct 17, 2017 by Dr. Neldon Mc.  He reports that his asthma symptoms have improved significantly while taking Brio Ellipta 200-25 g, 1 inhalation daily, and montelukast 10 mg daily bedtime.  Over the past month he has only required albuterol rescue on 3 occasions.  On all 3 occasions he was exerting himself physically with activities such as pulling roots the ground.  He has not been experiencing limitations in normal daily activities or nocturnal awakenings due to lower respiratory symptoms.  His nasal allergy symptoms are well controlled with fluticasone nasal spray 1-2 times daily.  He has no complaints today.  Assessment and plan: Moderate persistent asthma Currently well controlled.  Continue Brio Ellipta 200-25 g, 1 inhalation daily, montelukast 10 mg daily, and albuterol every 6 hours if needed.  May use albuterol 15 minutes prior to vigorous activity/exercise.  Subjective and objective measures of pulmonary function will be followed and the treatment plan will be adjusted accordingly.  Other allergic rhinitis Stable.  Continue appropriate allergen avoidance measures, montelukast 10 mg daily, and fluticasone nasal spray as needed.  Nasal saline spray (i.e. Simply Saline) is recommended prior to medicated nasal sprays and as needed.  Laryngopharyngeal reflux (LPR)  Continue appropriate reflux lifestyle modifications and omeprazole as prescribed.   No orders of the defined types were placed in this encounter.   Diagnostics: Spirometry  reveals an FVC of 3.25 L and an FEV1 of 2.25 L (70% predicted) with an FEV1 ratio of 90%.  Please see scanned spirometry results for details.    Physical examination: Blood pressure 118/72, pulse 74, resp. rate 18, SpO2 98 %.  General: Alert, interactive, in no acute distress. HEENT: TMs pearly gray, turbinates mildly edematous without discharge, post-pharynx mildly erythematous. Neck: Supple without lymphadenopathy. Lungs: Clear to auscultation without wheezing, rhonchi or rales. CV: Normal S1, S2 without murmurs. Skin: Warm and dry, without lesions or rashes.  The following portions of the patient's history were reviewed and updated as appropriate: allergies, current medications, past family history, past medical history, past social history, past surgical history and problem list.  Allergies as of 11/14/2017   No Known Allergies     Medication List        Accurate as of 11/14/17  5:52 PM. Always use your most recent med list.          albuterol 108 (90 Base) MCG/ACT inhaler Commonly known as:  PROVENTIL HFA;VENTOLIN HFA Inhale 2 puffs into the lungs every 4 (four) hours as needed for wheezing or shortness of breath.   cholecalciferol 1000 units tablet Commonly known as:  VITAMIN D Take 1,000 Units by mouth every morning.   fluticasone 50 MCG/ACT nasal spray Commonly known as:  FLONASE Place 1 spray into both nostrils 2 (two) times daily as needed for allergies or rhinitis.   fluticasone furoate-vilanterol 200-25 MCG/INH Aepb Commonly known as:  BREO ELLIPTA Inhale 1 puff into the lungs daily.   losartan 100 MG tablet Commonly known as:  COZAAR Take 50 mg by mouth every morning.   montelukast 10 MG tablet Commonly known as:  SINGULAIR  Take 1 tablet (10 mg total) by mouth at bedtime.   omeprazole 40 MG capsule Commonly known as:  PRILOSEC Take 1 capsule (40 mg total) by mouth daily.       No Known Allergies  I appreciate the opportunity to take part in  Alexander Burns's care. Please do not hesitate to contact me with questions.  Sincerely,   R. Edgar Frisk, MD

## 2017-11-14 NOTE — Assessment & Plan Note (Signed)
Currently well controlled.  Continue Brio Ellipta 200-25 g, 1 inhalation daily, montelukast 10 mg daily, and albuterol every 6 hours if needed.  May use albuterol 15 minutes prior to vigorous activity/exercise.  Subjective and objective measures of pulmonary function will be followed and the treatment plan will be adjusted accordingly.

## 2017-11-14 NOTE — Assessment & Plan Note (Signed)
Stable.  Continue appropriate allergen avoidance measures, montelukast 10 mg daily, and fluticasone nasal spray as needed.  Nasal saline spray (i.e. Simply Saline) is recommended prior to medicated nasal sprays and as needed.

## 2017-11-14 NOTE — Patient Instructions (Signed)
Moderate persistent asthma Currently well controlled.  Continue Brio Ellipta 200-25 g, 1 inhalation daily, montelukast 10 mg daily, and albuterol every 6 hours if needed.  May use albuterol 15 minutes prior to vigorous activity/exercise.  Subjective and objective measures of pulmonary function will be followed and the treatment plan will be adjusted accordingly.  Other allergic rhinitis Stable.  Continue appropriate allergen avoidance measures, montelukast 10 mg daily, and fluticasone nasal spray as needed.  Nasal saline spray (i.e. Simply Saline) is recommended prior to medicated nasal sprays and as needed.  Laryngopharyngeal reflux (LPR)  Continue appropriate reflux lifestyle modifications and omeprazole as prescribed.   Return in about 4 months (around 03/16/2018), or if symptoms worsen or fail to improve.

## 2018-03-19 ENCOUNTER — Ambulatory Visit: Payer: Non-veteran care | Admitting: Allergy and Immunology

## 2018-03-20 ENCOUNTER — Ambulatory Visit: Payer: Non-veteran care | Admitting: Allergy and Immunology

## 2018-03-21 IMAGING — CT CT RENAL STONE PROTOCOL
2 of 3 series · 17 of 46 positions shown, 19 images · non-contrast
Comparison: CT scan dated 07/30/2007

CLINICAL DATA: Hematuria and back pain.

EXAM:
CT ABDOMEN AND PELVIS WITHOUT CONTRAST
TECHNIQUE: Multidetector CT imaging of the abdomen and pelvis was performed
following the standard protocol without IV contrast.

[Series 4: coronal · coronal · 0.77mm/px · 3 of 166 slices shown]
[im 56/166  soft-tissue]
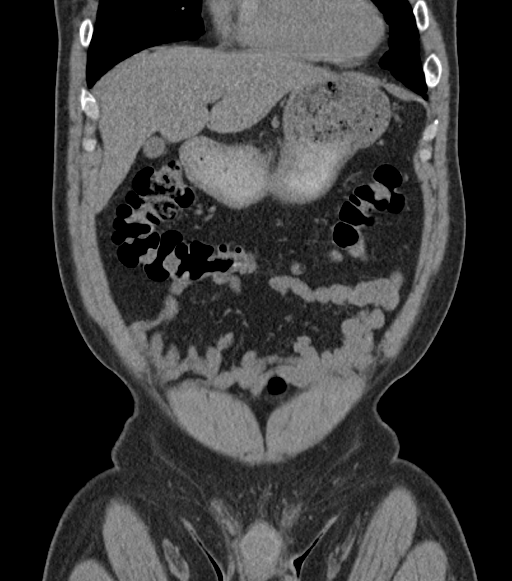
[im 74/166  soft-tissue]
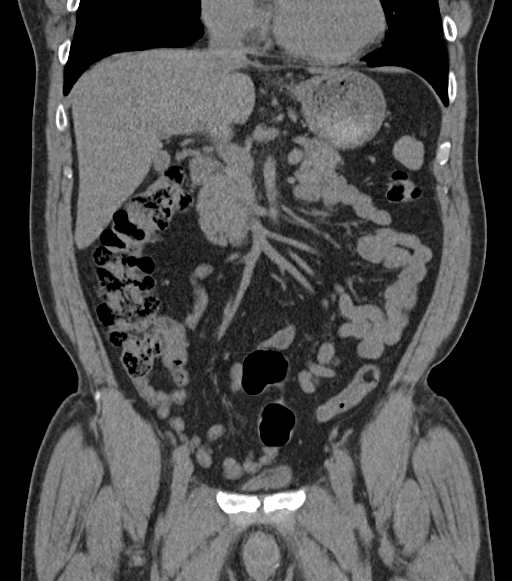
[im 92/166  soft-tissue]
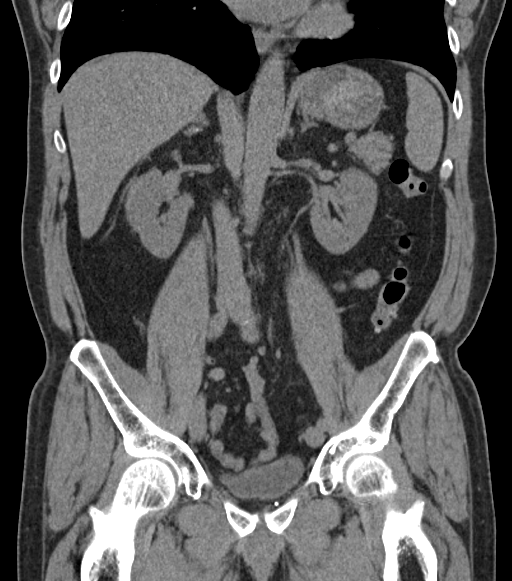

[Series 6: lung · axial · 0.79mm/px · z∈[-215,-123]mm · 14 of 54 slices shown, 16 images]
[im 4/54  soft-tissue]
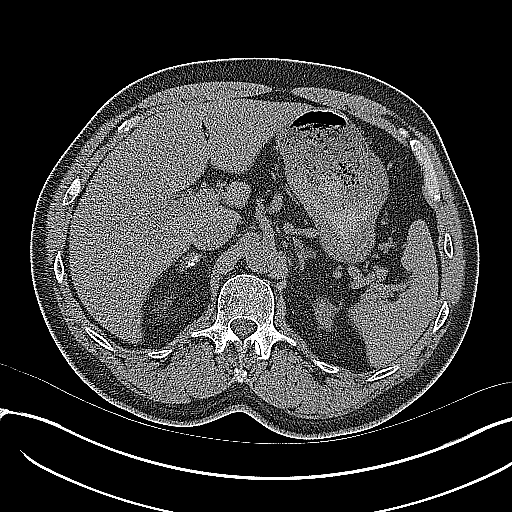
[im 4/54  bone]
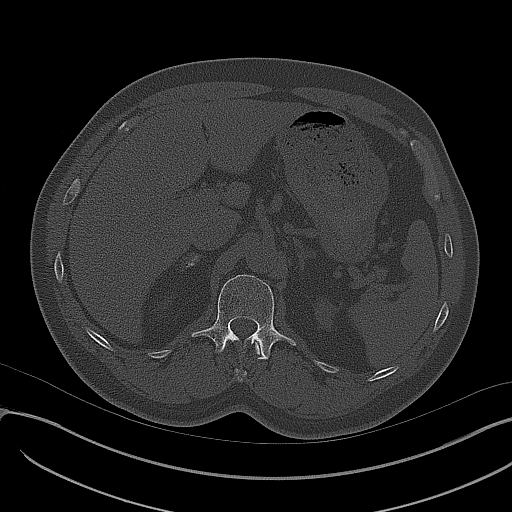
[im 7/54  soft-tissue]
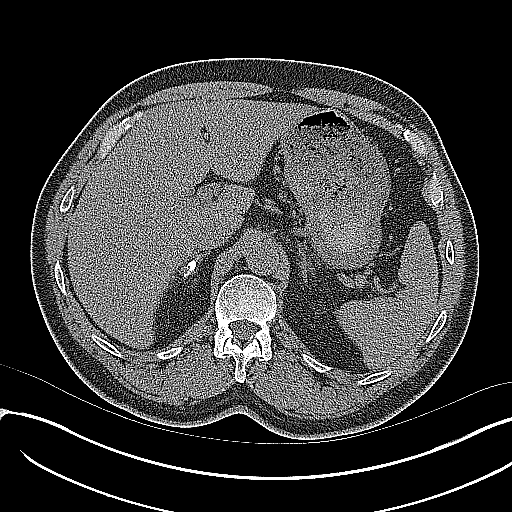
[im 11/54  soft-tissue]
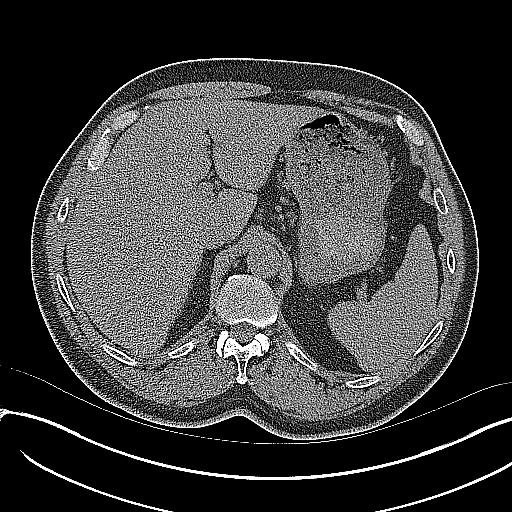
[im 14/54  soft-tissue]
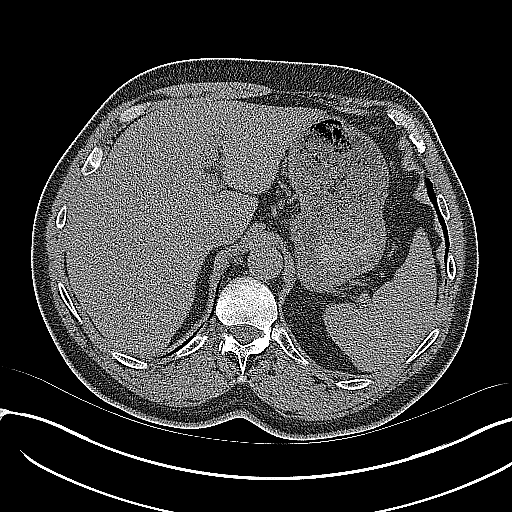
[im 18/54  soft-tissue]
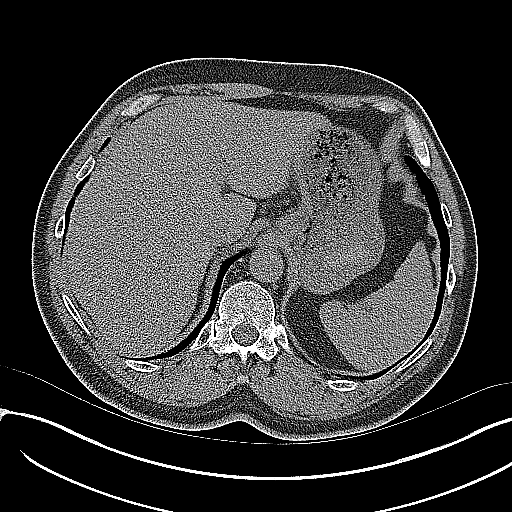
[im 21/54  soft-tissue]
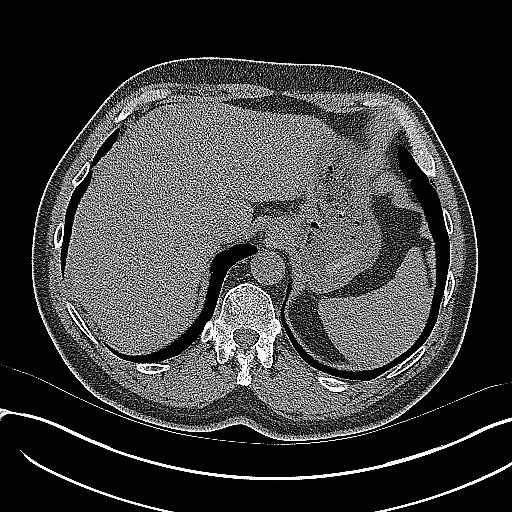
[im 24/54  soft-tissue]
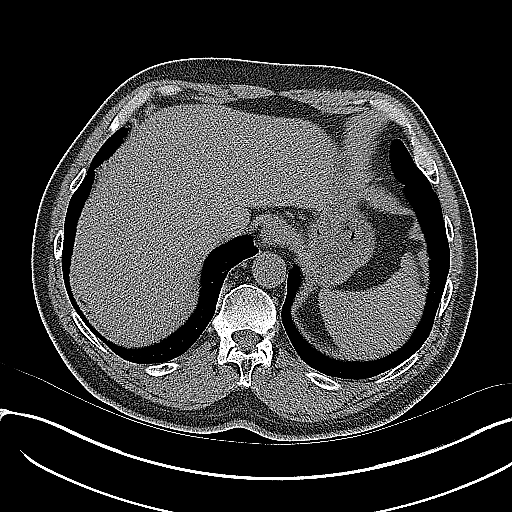
[im 30/54  soft-tissue]
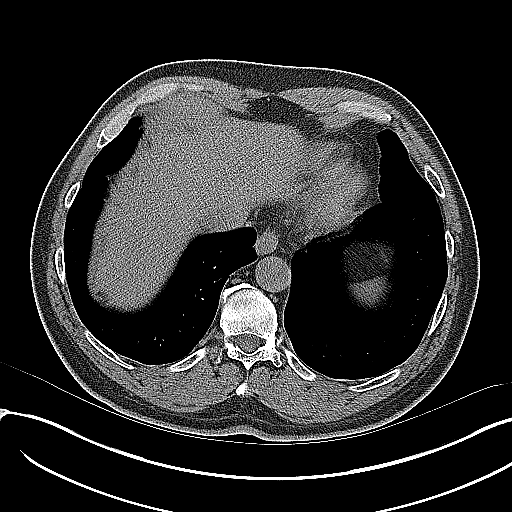
[im 33/54  soft-tissue]
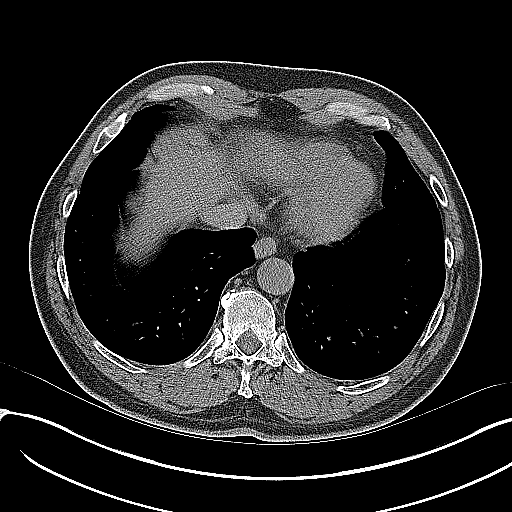
[im 33/54  bone]
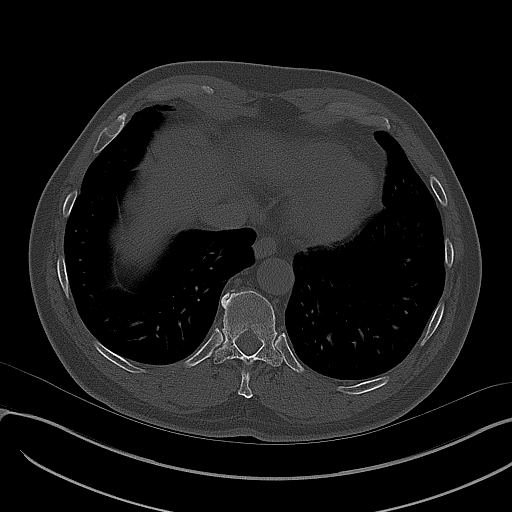
[im 36/54  soft-tissue]
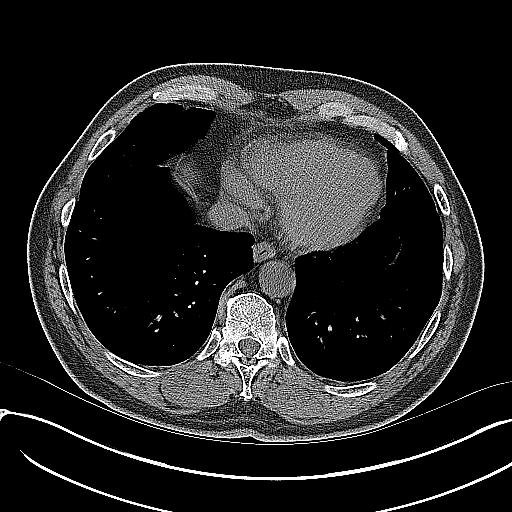
[im 40/54  soft-tissue]
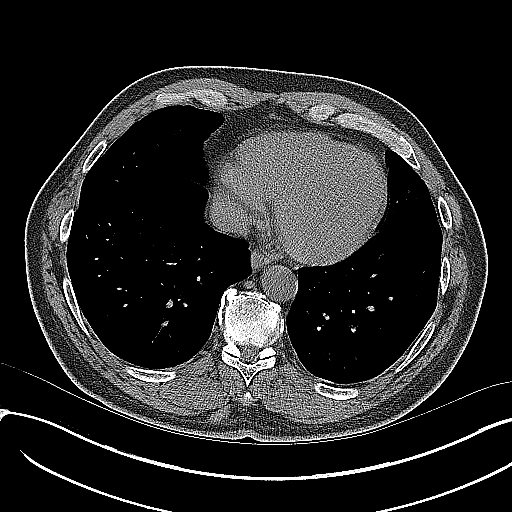
[im 43/54  soft-tissue]
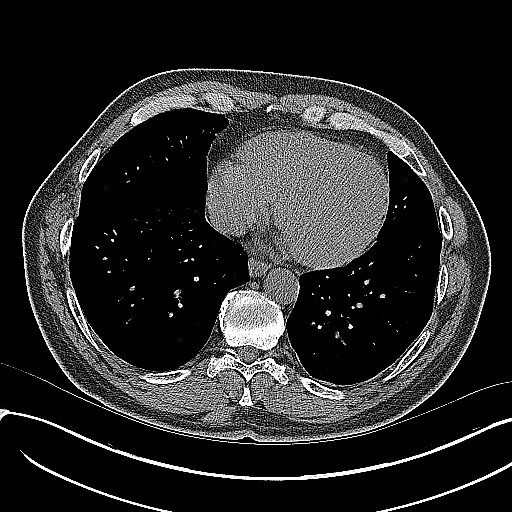
[im 47/54  soft-tissue]
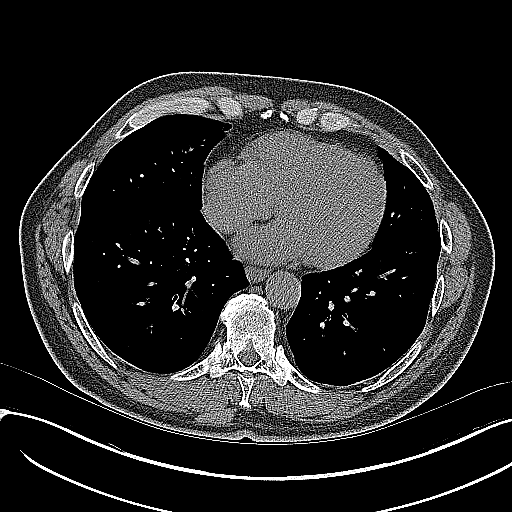
[im 50/54  soft-tissue]
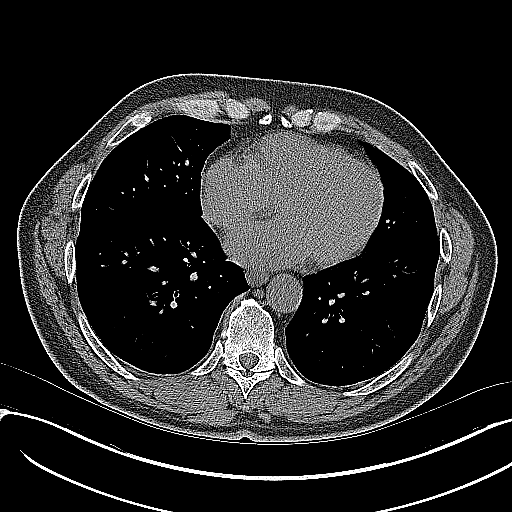

[17 of 46 positions shown; findings below may reference images not displayed]

FINDINGS: Lower chest: Normal.

Hepatobiliary: No focal liver abnormality is seen. No gallstones,
gallbladder wall thickening, or biliary dilatation.

Pancreas: Unremarkable. No pancreatic ductal dilatation or
surrounding inflammatory changes.

Spleen: Normal in size without focal abnormality.

Adrenals/Urinary Tract: Chronic calcification in the right adrenal
gland. Left adrenal gland is normal. 8 mm low-density area in the
upper pole of the right kidney. Fifteen and 12 mm low-density
lesions in the left mid kidney. These are most likely cysts. The
lesions on the left were present on the prior study. No
hydronephrosis. No renal or ureteral calculi. The bladder is normal.

Stomach/Bowel: Scattered diverticula in the descending colon. The
bowel is otherwise normal.

Vascular/Lymphatic: Atherosclerosis of the iliac arteries. No
adenopathy.

Reproductive: Prostate is unremarkable.

Other: No abdominal wall hernia or abnormality. No abdominopelvic
ascites.

Musculoskeletal: No acute or significant osseous findings.
IMPRESSION: No acute abnormalities.  No renal or ureteral or bladder calculi.

Diverticulosis of the left side of the colon.

## 2018-05-22 ENCOUNTER — Encounter: Payer: Self-pay | Admitting: Allergy and Immunology

## 2018-05-22 ENCOUNTER — Ambulatory Visit (INDEPENDENT_AMBULATORY_CARE_PROVIDER_SITE_OTHER): Payer: Medicare Other | Admitting: Allergy and Immunology

## 2018-05-22 VITALS — BP 124/68 | HR 64 | Resp 18

## 2018-05-22 DIAGNOSIS — J3089 Other allergic rhinitis: Secondary | ICD-10-CM

## 2018-05-22 DIAGNOSIS — J4540 Moderate persistent asthma, uncomplicated: Secondary | ICD-10-CM

## 2018-05-22 DIAGNOSIS — K219 Gastro-esophageal reflux disease without esophagitis: Secondary | ICD-10-CM

## 2018-05-22 MED ORDER — METHYLPREDNISOLONE ACETATE 80 MG/ML IJ SUSP
80.0000 mg | Freq: Once | INTRAMUSCULAR | Status: AC
  Administered 2018-05-22: 80 mg via INTRAMUSCULAR

## 2018-05-22 NOTE — Patient Instructions (Addendum)
  1.  Allergen avoidance measures  2.  Treat and prevent inflammation:   A.  Breo 200 -1 inhalation 1 time per day  B.  Fluticasone -1-2 sprays each nostril 1 time per day  C.  Montelukast 10 mg -1 tablet 1 time per day  D.  Depo-Medrol 80 IM delivered in clinic today  3.  Treat and prevent reflux:   A.  Consolidate all caffeine consumption (coffee / tea)   B.  Omeprazole 40 mg tablet 1 time per day  4.  If needed:   A.  Nasal saline spray  B.  OTC antihistamine  C.  Ventolin HFA or similar 2 inhalations every 4-6 hours  5.  Return to clinic in 6 months or earlier if problem

## 2018-05-22 NOTE — Progress Notes (Signed)
Follow-up Note  Referring Provider: No ref. provider found Primary Provider: [name], MD Date of Office Visit: 05/22/2018  Subjective:   Alexander Burns (DOB: 05/14/1951) is a 67 y.o. male who returns to the [facility] on 05/22/2018 in re-evaluation of the following:  HPI: Alexander Burns returns to this clinic in reevaluation of asthma and allergic rhinitis and LPR.  His last visit to this clinic with me was 17 Oct 2017.  He has stopped his anti-inflammatory agents for his airway and has stopped his therapy for reflux.  He now has issues with being out of breath whenever he uses his upper extremities such as loading up his truck.  Apparently he can walk without much problem.  He does relate a history of having a stress test about 3 years ago which apparently did not identify any heart disease.  He does not use a short acting bronchodilator.  He now has issues with constant postnasal drip and a glob and snorting.  He does not have any associated anosmia or ugly nasal discharge.  He has active reflux that he treats with Tums.  He continues to drink coffee and tea throughout the day.  He did obtain a flu vaccine this year.  Allergies as of 05/22/2018   No Known Allergies     Medication List      albuterol 108 (90 Base) MCG/ACT inhaler Commonly known as:  PROVENTIL HFA;VENTOLIN HFA Inhale 2 puffs into the lungs every 4 (four) hours as needed for wheezing or shortness of breath.   cholecalciferol 1000 units tablet Commonly known as:  VITAMIN D Take 1,000 Units by mouth every morning.   fluticasone 50 MCG/ACT nasal spray Commonly known as:  FLONASE Place 1 spray into both nostrils 2 (two) times daily as needed for allergies or rhinitis.   fluticasone furoate-vilanterol 200-25 MCG/INH Aepb Commonly known as:  BREO ELLIPTA Inhale 1 puff into the lungs daily.   losartan 100 MG tablet Commonly known as:  COZAAR Take 50 mg by mouth every morning.     montelukast 10 MG tablet Commonly known as:  SINGULAIR Take 1 tablet (10 mg total) by mouth at bedtime.   omeprazole 40 MG capsule Commonly known as:  PRILOSEC Take 1 capsule (40 mg total) by mouth daily.       Past Medical History:  Diagnosis Date  . Asthma   . Bladder cancer ([place]) 2018  . Hypertension     Past Surgical History:  Procedure Laterality Date  . BACK SURGERY    . FOOT SURGERY    . TONSILLECTOMY      Review of systems negative except as noted in HPI / PMHx or noted below:  Review of Systems  Constitutional: Negative.   HENT: Negative.   Eyes: Negative.   Respiratory: Negative.   Cardiovascular: Negative.   Gastrointestinal: Negative.   Genitourinary: Negative.   Musculoskeletal: Negative.   Skin: Negative.   Neurological: Negative.   Endo/Heme/Allergies: Negative.   Psychiatric/Behavioral: Negative.      Objective:   Vitals:   05/22/18 0957  BP: 124/68  Pulse: 64  Resp: 18  SpO2: 97%          Physical Exam Constitutional:      Appearance: He is not diaphoretic.  HENT:     Head: Normocephalic.     Right Ear: Tympanic membrane, ear canal and external ear normal.     Left Ear: Tympanic membrane, ear canal and external ear normal.  Nose: Mucosal edema present. No rhinorrhea.     Mouth/Throat:     Pharynx: Uvula midline. No oropharyngeal exudate.  Eyes:     Conjunctiva/sclera: Conjunctivae normal.  Neck:     Thyroid: No thyromegaly.     Trachea: Trachea normal. No tracheal tenderness or tracheal deviation.  Cardiovascular:     Rate and Rhythm: Normal rate and regular rhythm.     Heart sounds: Normal heart sounds, S1 normal and S2 normal. No murmur.  Pulmonary:     Effort: No respiratory distress.     Breath sounds: Normal breath sounds. No stridor. No wheezing or rales.  Lymphadenopathy:     Head:     Right side of head: No tonsillar adenopathy.     Left side of head: No tonsillar adenopathy.     Cervical: No cervical  adenopathy.  Skin:    Findings: No erythema or rash.     Nails: There is no clubbing.   Neurological:     Mental Status: He is alert.     Diagnostics:    Spirometry was performed and demonstrated an FEV1 of 1.77 at 55 % of predicted.  The patient had an Asthma Control Test with the following results: ACT Total Score: 23.    Assessment and Plan:   1. Not well controlled moderate persistent asthma   2. Other allergic rhinitis   3. Laryngopharyngeal reflux (LPR)     1.  Allergen avoidance measures  2.  Treat and prevent inflammation:   A.  Breo 200 -1 inhalation 1 time per day  B.  Fluticasone -1-2 sprays each nostril 1 time per day  C.  Montelukast 10 mg -1 tablet 1 time per day  D.  Depo-Medrol 80 IM delivered in clinic today  3.  Treat and prevent reflux:   A.  Consolidate all caffeine consumption (coffee / tea)   B.  Omeprazole 40 mg tablet 1 time per day  4.  If needed:   A.  Nasal saline spray  B.  OTC antihistamine  C.  Ventolin HFA or similar 2 inhalations every 4-6 hours  5.  Return to clinic in 6 months or earlier if problem  Alexander Burns has inflammation of his airway and I will treat him with anti-inflammatory agents as noted above including the use of a systemic steroid and I have encouraged him to restart all of his inhaled and nasal anti-inflammatory agents as noted above.  As well, he does have a history of LPR and he needs to use omeprazole and be a little bit more careful about caffeine consumption.  He is very atopic and would be a candidate for immunotherapy but at this point in time he is not very interested in undergoing this form of treatment.  I will see him back in this clinic in 6 months or earlier if there is a problem.  Allena Katz, MD Allergy / [facility] [place]

## 2018-05-23 ENCOUNTER — Encounter: Payer: Self-pay | Admitting: Allergy and Immunology

## 2018-06-11 ENCOUNTER — Telehealth: Payer: Self-pay | Admitting: *Deleted

## 2018-06-11 NOTE — Telephone Encounter (Signed)
Spoke to patient advised samples placed up front he verbalized understanding

## 2018-06-11 NOTE — Telephone Encounter (Signed)
Patient called requesting samples of breo, patient states can't afford the prescription. Please advise   [phone]

## 2018-10-11 ENCOUNTER — Other Ambulatory Visit: Payer: Self-pay | Admitting: Allergy and Immunology

## 2018-10-11 MED ORDER — FLUTICASONE-SALMETEROL 115-21 MCG/ACT IN AERO: 2.0000 | INHALATION_SPRAY | Freq: Two times a day (BID) | RESPIRATORY_TRACT | 3 refills | Status: DC

## 2018-10-11 NOTE — Telephone Encounter (Signed)
Patient medication was changed to Advair 115 per Dr. Neldon Mc and was sent into patient pharmacy.

## 2018-10-11 NOTE — Telephone Encounter (Signed)
Requesting a sample of Breo.

## 2018-10-11 NOTE — Telephone Encounter (Signed)
Advair 115-2 inhalations twice a day

## 2018-10-11 NOTE — Telephone Encounter (Signed)
Dr Kozlow please advise 

## 2018-10-11 NOTE — Telephone Encounter (Signed)
Patient informed me that the [employer] will not cover Breo so he pays for it from [facility]. It is very expensive this way. Can we try sending in Advair?

## 2018-10-11 NOTE — Telephone Encounter (Signed)
Sample placed upfront for patient.

## 2018-11-06 ENCOUNTER — Ambulatory Visit: Payer: Non-veteran care | Admitting: Allergy and Immunology

## 2018-11-08 ENCOUNTER — Other Ambulatory Visit: Payer: Self-pay

## 2018-11-08 ENCOUNTER — Ambulatory Visit (INDEPENDENT_AMBULATORY_CARE_PROVIDER_SITE_OTHER): Payer: Medicare Other | Admitting: Allergy and Immunology

## 2018-11-08 ENCOUNTER — Encounter: Payer: Self-pay | Admitting: Allergy and Immunology

## 2018-11-08 DIAGNOSIS — K219 Gastro-esophageal reflux disease without esophagitis: Secondary | ICD-10-CM

## 2018-11-08 DIAGNOSIS — J3089 Other allergic rhinitis: Secondary | ICD-10-CM

## 2018-11-08 DIAGNOSIS — J4540 Moderate persistent asthma, uncomplicated: Secondary | ICD-10-CM | POA: Diagnosis not present

## 2018-11-08 MED ORDER — OMEPRAZOLE 40 MG PO CPDR: 40.0000 mg | DELAYED_RELEASE_CAPSULE | Freq: Every day | ORAL | 5 refills | Status: DC

## 2018-11-08 NOTE — Patient Instructions (Addendum)
  1.  Continue to perform Allergen avoidance measures  2.  Continue to Treat and prevent inflammation:   A.  Breo 200 -1 inhalation 1 time per day  B.  Fluticasone -1-2 sprays each nostril 1 time per day  C.  Montelukast 10 mg -1 tablet 1 time per day  3.  Continue to Treat and prevent reflux:   A.  Consolidate all caffeine consumption (coffee / tea)   B.  Consistently use omeprazole 40 mg tablet 1 time per day  4.  If needed:   A.  Nasal saline spray  B.  OTC antihistamine  C.  Ventolin HFA or similar 2 inhalations every 4-6 hours  5.  Return to clinic in 6 months or earlier if problem

## 2018-11-08 NOTE — Progress Notes (Signed)
[facility] [place] [facility] [place]   Follow-up Note  Referring Provider: [name], MD Primary Provider: [name], MD Date of Office Visit: 11/08/2018  Subjective:   ALBAN MARUCCI (DOB: 1951/01/12) is a 68 y.o. male who returns to the [facility] on 11/08/2018 in re-evaluation of the following:  HPI: This is a E-med visit requested by patient who is located at home.  [place] is followed in his clinic for asthma and allergic rhinitis and LPR.  His last visit to this clinic was 22 May 2018.  Doing well with asthma. No systemic steroid use. SABA use minimal only use during digging or raking. Using Beo and Montelukast.  Nose doing well. Occasionally using nasal steroid.   Reflux OK. Using omeprazole rarely. One cup of coffee per day.   Still has PND and throat clearing cough which is better with treatment but not gone.  Allergies as of 11/08/2018   No Known Allergies     Medication List      albuterol 108 (90 Base) MCG/ACT inhaler Commonly known as:  VENTOLIN HFA Inhale 2 puffs into the lungs every 4 (four) hours as needed for wheezing or shortness of breath.   cholecalciferol 1000 units tablet Commonly known as:  VITAMIN D Take 1,000 Units by mouth every morning.   fluticasone 50 MCG/ACT nasal spray Commonly known as:  FLONASE Place 1 spray into both nostrils 2 (two) times daily as needed for allergies or rhinitis.   fluticasone furoate-vilanterol 200-25 MCG/INH Aepb Commonly known as:  Breo Ellipta Inhale 1 puff into the lungs daily.   losartan-hydrochlorothiazide 100-12.5 MG tablet Commonly known as:  HYZAAR   omeprazole 40 MG capsule Commonly known as:  PRILOSEC Take 1 capsule (40 mg total) by mouth daily.       Past Medical History:  Diagnosis Date  . Asthma   . Bladder cancer ([place]) 2018  . Hypertension     Past Surgical History:  Procedure Laterality Date  . BACK SURGERY    . FOOT SURGERY     . TONSILLECTOMY      Review of systems negative except as noted in HPI / PMHx or noted below:  Review of Systems  Constitutional: Negative.   HENT: Negative.   Eyes: Negative.   Respiratory: Negative.   Cardiovascular: Negative.   Gastrointestinal: Negative.   Genitourinary: Negative.   Musculoskeletal: Negative.   Skin: Negative.   Neurological: Negative.   Endo/Heme/Allergies: Negative.   Psychiatric/Behavioral: Negative.      Objective:   There were no vitals filed for this visit.        Physical Exam -deferred  Diagnostics: none  Assessment and Plan:   1. Moderate persistent asthma without complication   2. Other allergic rhinitis   3. Laryngopharyngeal reflux (LPR)     1.  Continue to perform Allergen avoidance measures  2.  Continue to Treat and prevent inflammation:   A.  Breo 200 -1 inhalation 1 time per day  B.  Fluticasone -1-2 sprays each nostril 1 time per day  C.  Montelukast 10 mg -1 tablet 1 time per day  3.  Continue to Treat and prevent reflux:   A.  Consolidate all caffeine consumption (coffee / tea)   B.  Consistently use omeprazole 40 mg tablet 1 time per day  4.  If needed:   A.  Nasal saline spray  B.  OTC antihistamine  C.  Ventolin HFA or similar 2  inhalations every 4-6 hours  5.  Return to clinic in 6 months or earlier if problem  Terre appears to be doing okay on his current plan of therapy directed against respiratory tract inflammation.  However, his LPR is not adequately treated and I have encouraged him to consistently use omeprazole to address this issue.  We will refill all of his medications today and I will see him back in this clinic and 6 months or earlier if there is a problem.  Total patient interaction time 20 minutes  Allena Katz, MD Allergy / [place]

## 2018-11-12 ENCOUNTER — Encounter: Payer: Self-pay | Admitting: Allergy and Immunology

## 2018-11-13 NOTE — Progress Notes (Addendum)
Telephone Visit:   Patient is at home. Provider is in the office.  Start time: 8:46 am End time: 9:07 am Verbal consent to file insurance was given.

## 2018-12-05 ENCOUNTER — Other Ambulatory Visit: Payer: Self-pay | Admitting: *Deleted

## 2018-12-05 DIAGNOSIS — Z20822 Contact with and (suspected) exposure to covid-19: Secondary | ICD-10-CM

## 2018-12-08 LAB — NOVEL CORONAVIRUS, NAA: SARS-CoV-2, NAA: NOT DETECTED

## 2019-10-08 ENCOUNTER — Telehealth: Payer: Self-pay | Admitting: Gastroenterology

## 2019-10-08 NOTE — Telephone Encounter (Signed)
Received referral from [facility] [place] for EUS/ERCP.  Patient had colonoscopy done at [facility] on 09/09/2019.  Sending records up for your review

## 2019-10-08 NOTE — Telephone Encounter (Signed)
Mendel Ryder please see notes regarding additional records needed.

## 2019-10-08 NOTE — Telephone Encounter (Signed)
I will review these today if they get to my Inbox. I will return to the office on Thursday. Thank you. GM

## 2019-10-08 NOTE — Telephone Encounter (Signed)
I was able to review the patient's documentation  Request is for EUS for possible submucosal lesion at 30 cm Colonoscopy report is not actually in the packet but there is a letter that suggests that the patient was found to have diverticulosis of the colon and recommended a high-fiber diet.  There was discussion that there was a possible submucosal lesion/subepithelial lesion at around 30 cm from the anal verge.  They wanted to arrange for this area to be looked at in a different way with a special test at [facility] which is the indication for the endoscopic ultrasound and possible miniprobe need.  Patient's history looks to be consistent with COPD, previous bladder cancer in 2019, hyper lipidemia, high blood pressure as well as GERD as well as potentially asthma but otherwise seems to be in relatively good health.  April 2021 labs AST/ALT 24/38 Total bili 0.5 Creatinine 0.94 Sodium 139 Potassium 3.8 Triglyceride 63  Next steps in evaluation are as follows: 1) please obtain the actual colonoscopy report for review as well as review of images if possible 2) schedule nonurgent flexible sigmoidoscopy with EUS/possible miniprobe use (I have confirmed with the staff that a mini probe is available if needed) however hopefully will be able to just use the radial echoendoscope  Please let DJ or I know once the colonoscopy report is available. But okay to move forward with scheduling at patient's convenience.  GM

## 2019-10-09 ENCOUNTER — Other Ambulatory Visit: Payer: Self-pay

## 2019-10-09 DIAGNOSIS — K639 Disease of intestine, unspecified: Secondary | ICD-10-CM

## 2019-10-09 NOTE — Telephone Encounter (Signed)
EUS scheduled, pt instructed and medications reviewed.  Patient instructions available to the pt via My Chart.  Confirmed pt has the ability to log into My Chart. Patient to call with any questions or concerns.

## 2019-10-09 NOTE — Telephone Encounter (Signed)
[facility] EUS has been scheduled with Dr Ardis Hughs on 11/14/19 at 830 am [facility] COVID test on 11/11/19 at 1010 am

## 2019-11-11 ENCOUNTER — Telehealth: Payer: Self-pay | Admitting: Gastroenterology

## 2019-11-11 ENCOUNTER — Other Ambulatory Visit (HOSPITAL_COMMUNITY)
Admission: RE | Admit: 2019-11-11 | Discharge: 2019-11-11 | Disposition: A | Discharge status: Home / Self Care | Payer: Medicare Other | Source: Ambulatory Visit | Attending: Gastroenterology | Admitting: Gastroenterology

## 2019-11-11 DIAGNOSIS — Z20822 Contact with and (suspected) exposure to covid-19: Secondary | ICD-10-CM | POA: Diagnosis not present

## 2019-11-11 DIAGNOSIS — Z01812 Encounter for preprocedural laboratory examination: Secondary | ICD-10-CM | POA: Diagnosis present

## 2019-11-11 LAB — SARS CORONAVIRUS 2 (TAT 6-24 HRS): SARS Coronavirus 2: NEGATIVE

## 2019-11-11 NOTE — Telephone Encounter (Signed)
Thank you Es when he called this morning he told me he did NOT want to use medicare.  Just making sure I don't need to do anything.

## 2019-11-11 NOTE — Telephone Encounter (Signed)
Amy can you make sure this pt VA information is in the referral?  The pt called to make sure that the [place] information was used and not his medicare thank you

## 2019-11-11 NOTE — Telephone Encounter (Signed)
This is what I knew as of last week from Es:  [place], [place], [place] pt states that he has mcare a/b and aarp and he is not going to go through [place] for this       Previous Messages   ----- Message -----  From: Darden Dates  Sent: 11/07/2019  7:33 AM EDT  To: Delane Ginger [place]   Good morning Es,  We have never seen this man before. He is scheduled for an EUS. I'm guessing he has VA? Nothing scanned in of course. Can you check into it?  Thanks,  Amy    But then I just found this in the chart.  Apparently auth info had not been scanned in when appt made:  Vincente Poli   LS  11:26 AM Note   Received referral from [facility] for EUS/ERCP.  Patient had colonoscopy done at [facility] on 09/09/2019.  Sending records up for your review      Should be in records somewhere (look at note from 10/08/19)

## 2019-11-13 NOTE — Anesthesia Preprocedure Evaluation (Addendum)
Anesthesia Evaluation  Patient identified by MRN, date of birth, ID band Patient awake    Reviewed: Allergy & Precautions, NPO status , Patient's Chart, lab work & pertinent test results  Airway Mallampati: II  TM Distance: >3 FB Neck ROM: Full    Dental no notable dental hx. (+) Teeth Intact, Dental Advisory Given   Pulmonary shortness of breath, asthma , former smoker,    Pulmonary exam normal breath sounds clear to auscultation       Cardiovascular hypertension, Pt. on medications +CHF  Normal cardiovascular exam Rhythm:Regular Rate:Normal  Echo 2015 - Left ventricle: The cavity size was normal. Wall thickness was increased in a pattern of mild LVH. Systolic function was normal. The estimated ejection fraction was in the range of 60% to 65%. Wall motion was normal; there were no regional wall motion abnormalities. Doppler parameters are consistent with abnormalleft ventricular relaxation (grade 1 diastolic dysfunction).  - Right ventricle: The cavity size was mildly dilated.  - Right atrium: The atrium was mildly dilated.      Neuro/Psych negative neurological ROS     GI/Hepatic negative GI ROS, Neg liver ROS,   Endo/Other  negative endocrine ROS  Renal/GU negative Renal ROS     Musculoskeletal negative musculoskeletal ROS (+)   Abdominal   Peds  Hematology negative hematology ROS (+)   Anesthesia Other Findings   Reproductive/Obstetrics                            Anesthesia Physical Anesthesia Plan  ASA: III  Anesthesia Plan: MAC   Post-op Pain Management:    Induction: Intravenous  PONV Risk Score and Plan: 1 and Propofol infusion, TIVA and Treatment may vary due to age or medical condition  Airway Management Planned:   Additional Equipment:   Intra-op Plan:   Post-operative Plan:   Informed Consent: I have reviewed the patients History and Physical, chart, labs  and discussed the procedure including the risks, benefits and alternatives for the proposed anesthesia with the patient or authorized representative who has indicated his/her understanding and acceptance.     Dental advisory given  Plan Discussed with: CRNA  Anesthesia Plan Comments:         Anesthesia Quick Evaluation

## 2019-11-14 ENCOUNTER — Ambulatory Visit (HOSPITAL_COMMUNITY): Payer: No Typology Code available for payment source | Admitting: Anesthesiology

## 2019-11-14 ENCOUNTER — Encounter (HOSPITAL_COMMUNITY): Payer: Self-pay | Admitting: Gastroenterology

## 2019-11-14 ENCOUNTER — Other Ambulatory Visit: Payer: Self-pay

## 2019-11-14 ENCOUNTER — Encounter (HOSPITAL_COMMUNITY)
Admission: RE | Disposition: A | Discharge status: Home / Self Care | Payer: Self-pay | Source: Home / Self Care | Attending: Gastroenterology

## 2019-11-14 ENCOUNTER — Telehealth: Payer: Self-pay

## 2019-11-14 ENCOUNTER — Ambulatory Visit (HOSPITAL_COMMUNITY)
Admission: RE | Admit: 2019-11-14 | Discharge: 2019-11-14 | Disposition: A | Discharge status: Home / Self Care | Payer: No Typology Code available for payment source | Attending: Gastroenterology | Admitting: Gastroenterology

## 2019-11-14 DIAGNOSIS — K629 Disease of anus and rectum, unspecified: Secondary | ICD-10-CM | POA: Diagnosis present

## 2019-11-14 DIAGNOSIS — K6389 Other specified diseases of intestine: Secondary | ICD-10-CM | POA: Diagnosis not present

## 2019-11-14 DIAGNOSIS — K639 Disease of intestine, unspecified: Secondary | ICD-10-CM

## 2019-11-14 DIAGNOSIS — D175 Benign lipomatous neoplasm of intra-abdominal organs: Secondary | ICD-10-CM | POA: Insufficient documentation

## 2019-11-14 HISTORY — PX: EUS: SHX5427

## 2019-11-14 HISTORY — PX: SUBMUCOSAL TATTOO INJECTION: SHX6856

## 2019-11-14 HISTORY — PX: FLEXIBLE SIGMOIDOSCOPY: SHX5431

## 2019-11-14 SURGERY — ULTRASOUND, LOWER GI TRACT, ENDOSCOPIC
Anesthesia: Monitor Anesthesia Care

## 2019-11-14 MED ORDER — SPOT INK MARKER SYRINGE KIT
PACK | SUBMUCOSAL | Status: DC | PRN
  Administered 2019-11-14: 2.5 mL via SUBMUCOSAL

## 2019-11-14 MED ORDER — LACTATED RINGERS IV SOLN: INTRAVENOUS | Status: DC | PRN

## 2019-11-14 MED ORDER — PROPOFOL 500 MG/50ML IV EMUL
INTRAVENOUS | Status: DC | PRN
  Administered 2019-11-14: 100 ug/kg/min via INTRAVENOUS
  Administered 2019-11-14: 80 mg via INTRAVENOUS
  Administered 2019-11-14: 40 mg via INTRAVENOUS

## 2019-11-14 MED ORDER — SODIUM CHLORIDE 0.9 % IV SOLN: INTRAVENOUS | Status: DC

## 2019-11-14 MED ORDER — SPOT INK MARKER SYRINGE KIT
PACK | SUBMUCOSAL | Status: AC
  Filled 2019-11-14: qty 5

## 2019-11-14 NOTE — Telephone Encounter (Signed)
Spoke with patient, patient states that he goes to the [facility] in [place], called [place] and they stated that the reports get faxed back to the community center not to a direct provider, will fax report today, just wanted to make you aware. Thank you.

## 2019-11-14 NOTE — Anesthesia Postprocedure Evaluation (Signed)
Anesthesia Post Note  Patient: Deniro Laymon [id number]  Procedure(s) Performed: LOWER ENDOSCOPIC ULTRASOUND (EUS) (N/A ) SUBMUCOSAL TATTOO INJECTION     Patient location during evaluation: PACU Anesthesia Type: MAC Level of consciousness: awake and alert Pain management: pain level controlled Vital Signs Assessment: post-procedure vital signs reviewed and stable Respiratory status: spontaneous breathing Cardiovascular status: stable Anesthetic complications: no   No complications documented.  Last Vitals:  Vitals:   11/14/19 0900 11/14/19 0910  BP: 127/74 (!) 143/76  Resp: 19 (!) 26  Temp:    SpO2: 98%     Last Pain:  Vitals:   11/14/19 0850  TempSrc: Oral  PainSc: 0-No pain                 Nolon Nations

## 2019-11-14 NOTE — Telephone Encounter (Signed)
-----   Message from Milus Banister, MD sent at 11/14/2019  9:06 AM EDT ----- Can you please send a copy of today's EUS to his referring [facility] provider (gastroenterology).  I honestly have no idea who that was however.  Thanks

## 2019-11-14 NOTE — Transfer of Care (Signed)
Immediate Anesthesia Transfer of Care Note  Patient: Alexander Burns [id number]  Procedure(s) Performed: Procedure(s) with comments: LOWER ENDOSCOPIC ULTRASOUND (EUS) (N/A) SUBMUCOSAL TATTOO INJECTION - mini probe ultrasound  Patient Location: PACU and Endoscopy Unit  Anesthesia Type:MAC  Level of Consciousness: awake, alert  and oriented  Airway & Oxygen Therapy: Patient Spontanous Breathing and Patient connected to nasal cannula oxygen  Post-op Assessment: Report given to RN and Post -op Vital signs reviewed and stable  Post vital signs: Reviewed and stable  Last Vitals:  Vitals:   11/14/19 0713  BP: (!) 150/86  Resp: (!) 25  Temp: 37.3 C  SpO2: (!) 27%    Complications: No apparent anesthesia complications

## 2019-11-14 NOTE — H&P (Signed)
HPI: This is a man who was found to have a submucosal lesion during [facility]oscopy at [facility] about two months ago.  CRC does not run in his family.    ROS: complete GI ROS as described in HPI, all other review negative.  Constitutional:  No unintentional weight loss   Past Medical History:  Diagnosis Date  . Asthma   . Bladder cancer ([place]) 2018  . Hypertension     Past Surgical History:  Procedure Laterality Date  . BACK SURGERY    . FOOT SURGERY    . TONSILLECTOMY      No current facility-administered medications for this encounter.    Allergies as of 10/09/2019  . (No Known Allergies)    Family History  Problem Relation Age of Onset  . Diabetes Other   . Hypertension Other   . CAD Other   . Diabetes Mother   . Diabetes Father   . Diabetes Sister   . Breast cancer Sister   . Asthma Maternal Grandmother     Social History   Socioeconomic History  . Marital status: Married    Spouse name: Not on file  . Number of children: Not on file  . Years of education: Not on file  . Highest education level: Not on file  Occupational History  . Not on file  Tobacco Use  . Smoking status: Former Smoker    Packs/day: 1.00    Years: 3.00    Pack years: 3.00    Types: Cigars, Cigarettes    Quit date: 2009    Years since quitting: 12.4  . Smokeless tobacco: Never Used  Vaping Use  . Vaping Use: Never used  Substance and Sexual Activity  . Alcohol use: Yes    Comment: socially  . Drug use: No  . Sexual activity: Not on file  Other Topics Concern  . Not on file  Social History Narrative  . Not on file   Social Determinants of Health   Financial Resource Strain:   . Difficulty of Paying Living Expenses:   Food Insecurity:   . Worried About Charity fundraiser in the Last Year:   . Arboriculturist in the Last Year:   Transportation Needs:   . Film/video editor (Medical):   Marland Kitchen Lack of Transportation (Non-Medical):   Physical Activity:   . Days of  Exercise per Week:   . Minutes of Exercise per Session:   Stress:   . Feeling of Stress :   Social Connections:   . Frequency of Communication with Friends and Family:   . Frequency of Social Gatherings with Friends and Family:   . Attends Religious Services:   . Active Member of Clubs or Organizations:   . Attends Archivist Meetings:   Marland Kitchen Marital Status:   Intimate Partner Violence:   . Fear of Current or Ex-Partner:   . Emotionally Abused:   Marland Kitchen Physically Abused:   . Sexually Abused:      Physical Exam: BP (!) 164/97   Temp 99.1 F (37.3 C) (Oral)   Resp (!) 25   Ht 6' (1.829 m)   Wt 93.4 kg   SpO2 (!) 24%   BMI 27.94 kg/m  Constitutional: generally well-appearing Psychiatric: alert and oriented x3 Abdomen: soft, nontender, nondistended, no obvious ascites, no peritoneal signs, normal bowel sounds No peripheral edema noted in lower extremities  Assessment and plan: 69 y.o. male with submucosal lesion in his colon around 30cm from  anus  For lower eus evaluation of the above  Please see the "Patient Instructions" section for addition details about the plan.  Owens Loffler, MD [facility] 11/14/2019, 7:19 AM

## 2019-11-14 NOTE — Discharge Instructions (Signed)

## 2019-11-14 NOTE — Op Note (Signed)
[facility] Patient Name: Alexander Burns Procedure Date: 11/14/2019 MRN: [id number] Attending MD: Milus Banister , MD Date of Birth: 07-04-1950 CSN: [id number] Age: 69 Admit Type: Outpatient Procedure:                Lower EUS Indications:              Submucosal lesion noted at 30cm from anus during                            recent [facility] for routine [facility]                            screening Providers:                Milus Banister, MD, Cleda Daub, RN, Theodora Blow, Technician Referring MD:             Senaida Lange. gastroenterology Medicines:                Monitored Anesthesia Care Complications:            No immediate complications. Estimated blood loss:                            None. Estimated Blood Loss:     Estimated blood loss: none. Procedure:                Pre-Anesthesia Assessment:                           - Prior to the procedure, a History and Physical                            was performed, and patient medications and                            allergies were reviewed. The patient's tolerance of                            previous anesthesia was also reviewed. The risks                            and benefits of the procedure and the sedation                            options and risks were discussed with the patient.                            All questions were answered, and informed consent                            was obtained. Prior Anticoagulants: The patient has                            taken no  previous anticoagulant or antiplatelet                            agents. ASA Grade Assessment: II - A patient with                            mild systemic disease. After reviewing the risks                            and benefits, the patient was deemed in                            satisfactory condition to undergo the procedure.                           After obtaining informed consent, the  endoscope was                            passed under direct vision. Throughout the                            procedure, the patient's blood pressure, pulse, and                            oxygen saturations were monitored continuously. The                            CF-HQ190L ([id number]) Olympus colonoscope was                            introduced through the anus and advanced to the the                            splenic flexure. The lower EUS was accomplished                            without difficulty. The patient tolerated the                            procedure well. The quality of the bowel                            preparation was good. Scope In: Scope Out: Findings:      ENDOSCOPIC FINDING: :      - There was a medium-sized, soft, subtly yellow, broadly pedunculated       subepthelial lesion that was approximately 22mm in diameter in the       sigmoid colon at 30cm from the anus. The overly mucosa had two small       erosions. Following EUS miniprobe evluation the site was tattooed with       an injection of Spot (carbon black).      - Left colon diverticulosis.      EUS findings (miniprobe through adult colonoscope):      1. The subepithelial lesion described above correlated with a  hyperechoic, well circumscribed lesion within the deep mucosa and       submucosa layers, measuring approximately 21mm across. 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 The overlying mucosa                            has two small erosions of doubtful clinical                            signficance. I labeled the site with submucosal                            injection of SPOT to aid in future localation                            (endoscopically or surgically) if ever needed. Moderate Sedation:      Not Applicable - Patient had care per Anesthesia. Recommendation:           - Discharge patient to home (ambulatory).                           - No further testing is  necessary for the sigmoid                            lipoma.                           - This will be communicated with his [facility]                            gastroenterologist. Procedure Code(s):        --- Professional ---                           [phone], Sigmoidoscopy, flexible; with endoscopic                            ultrasound examination                           36644, Sigmoidoscopy, flexible; with directed                            submucosal injection(s), any substance Diagnosis Code(s):        --- Professional ---                           D17.5, Benign lipomatous neoplasm of                            intra-abdominal organs                           K63.89, Other specified diseases of intestine CPT copyright 2019 American Medical Association. All rights reserved. The codes documented in this report are preliminary and upon coder review may  be  revised to meet current compliance requirements. Milus Banister, MD 11/14/2019 9:02:54 AM This report has been signed electronically. Number of Addenda: 0

## 2019-11-15 ENCOUNTER — Encounter (HOSPITAL_COMMUNITY): Payer: Self-pay | Admitting: Gastroenterology

## 2019-11-18 ENCOUNTER — Telehealth: Payer: Self-pay | Admitting: Gastroenterology

## 2019-11-18 NOTE — Telephone Encounter (Signed)
The pt was concerned about the benign area found on colon.  He did not remember what was told to him at his procedure. I read to him from the report that states no further testing needed.  Pt thanked me for calling.

## 2019-11-18 NOTE — Telephone Encounter (Signed)
Pls call pt, he has questions about his results.

## 2019-12-03 ENCOUNTER — Other Ambulatory Visit: Payer: Self-pay

## 2019-12-03 ENCOUNTER — Ambulatory Visit (INDEPENDENT_AMBULATORY_CARE_PROVIDER_SITE_OTHER): Payer: Medicare Other | Admitting: Allergy and Immunology

## 2019-12-03 ENCOUNTER — Encounter: Payer: Self-pay | Admitting: Allergy and Immunology

## 2019-12-03 VITALS — BP 154/70 | HR 61 | Resp 18 | Ht 72.0 in | Wt 207.2 lb

## 2019-12-03 DIAGNOSIS — J449 Chronic obstructive pulmonary disease, unspecified: Secondary | ICD-10-CM | POA: Diagnosis not present

## 2019-12-03 DIAGNOSIS — K219 Gastro-esophageal reflux disease without esophagitis: Secondary | ICD-10-CM

## 2019-12-03 DIAGNOSIS — J3089 Other allergic rhinitis: Secondary | ICD-10-CM | POA: Diagnosis not present

## 2019-12-03 DIAGNOSIS — J4489 Other specified chronic obstructive pulmonary disease: Secondary | ICD-10-CM

## 2019-12-03 MED ORDER — MONTELUKAST SODIUM 10 MG PO TABS
10.0000 mg | ORAL_TABLET | Freq: Every day | ORAL | 1 refills | Status: DC
Start: 2019-12-03 — End: 2020-05-19

## 2019-12-03 MED ORDER — AIRDUO DIGIHALER 113-14 MCG/ACT IN AEPB: 1.0000 | INHALATION_SPRAY | Freq: Every day | RESPIRATORY_TRACT | 5 refills | Status: DC

## 2019-12-03 NOTE — Progress Notes (Signed)
[facility] [place] [facility] [place]   Follow-up Note  Referring Provider: [name], MD Primary Provider: [name], MD Date of Office Visit: 12/03/2019  Subjective:   Alexander Burns (DOB: 1950-12-25) is a 69 y.o. male who returns to the [facility] on 12/03/2019 in re-evaluation of the following:  HPI: Asa returns to this clinic in reevaluation of asthma and allergic rhinitis and LPR.  His last contact with this clinic was via a E-med visit on 11/08/2018.  Overall he has done very well over the course of the past year with his airway and has not required a systemic steroid or an antibiotic for any type of airway issue.  He does find that he gets short of breath if he exerts himself to any significant degree especially if he is using his arms with activity.  He does better when using Breo but unfortunately Breo was too expensive for him to afford.  He now uses a short acting bronchodilator around the time of exercise.  He has had very little issues with his nose while intermittently using montelukast and a nasal steroid.  He has had very little issues with reflux while intermittently using omeprazole.  He has obtain 2 Moderna Covid vaccinations.  Allergies as of 12/03/2019   No Known Allergies     Medication List      albuterol 108 (90 Base) MCG/ACT inhaler Commonly known as: VENTOLIN HFA Inhale 2 puffs into the lungs every 4 (four) hours as needed for wheezing or shortness of breath.   cholecalciferol 1000 units tablet Commonly known as: VITAMIN D Take 1,000 Units by mouth every morning.   fluticasone 50 MCG/ACT nasal spray Commonly known as: FLONASE Place 1 spray into both nostrils 2 (two) times daily as needed for allergies or rhinitis.   fluticasone furoate-vilanterol 200-25 MCG/INH Aepb Commonly known as: Breo Ellipta Inhale 1 puff into the lungs daily.   losartan-hydrochlorothiazide 100-12.5 MG tablet Commonly  known as: HYZAAR Take 1 tablet by mouth daily.   multivitamin with minerals tablet Take 1 tablet by mouth daily.   omeprazole 40 MG capsule Commonly known as: PRILOSEC Take 1 capsule (40 mg total) by mouth daily.       Past Medical History:  Diagnosis Date  . Asthma   . Bladder cancer ([place]) 2018  . Hypertension     Past Surgical History:  Procedure Laterality Date  . BACK SURGERY    . EUS N/A 11/14/2019   Procedure: LOWER ENDOSCOPIC ULTRASOUND (EUS);  Surgeon: Milus Banister, MD;  Location: [facility];  Service: Endoscopy;  Laterality: N/A;  . FLEXIBLE SIGMOIDOSCOPY N/A 11/14/2019   Procedure: FLEXIBLE SIGMOIDOSCOPY;  Surgeon: Milus Banister, MD;  Location: [facility];  Service: Endoscopy;  Laterality: N/A;  . FOOT SURGERY    . SUBMUCOSAL TATTOO INJECTION  11/14/2019   Procedure: SUBMUCOSAL TATTOO INJECTION;  Surgeon: Milus Banister, MD;  Location: [facility];  Service: Endoscopy;;  mini probe ultrasound  . TONSILLECTOMY      Review of systems negative except as noted in HPI / PMHx or noted below:  Review of Systems  Constitutional: Negative.   HENT: Negative.   Eyes: Negative.   Respiratory: Negative.   Cardiovascular: Negative.   Gastrointestinal: Negative.   Genitourinary: Negative.   Musculoskeletal: Negative.   Skin: Negative.   Neurological: Negative.   Endo/Heme/Allergies: Negative.   Psychiatric/Behavioral: Negative.      Objective:   Vitals:   12/03/19 0911  BP: (!) 154/70  Pulse: 61  Resp: 18  SpO2: 98%   Height: 6' (182.9 cm)  Weight: 207 lb 3.2 oz (94 kg)   Physical Exam Constitutional:      Appearance: He is not diaphoretic.  HENT:     Head: Normocephalic.     Right Ear: Tympanic membrane, ear canal and external ear normal.     Left Ear: Tympanic membrane, ear canal and external ear normal.     Nose: Nose normal. No mucosal edema or rhinorrhea.     Mouth/Throat:     Pharynx: Uvula midline. No oropharyngeal exudate.  Eyes:       Conjunctiva/sclera: Conjunctivae normal.  Neck:     Thyroid: No thyromegaly.     Trachea: Trachea normal. No tracheal tenderness or tracheal deviation.  Cardiovascular:     Rate and Rhythm: Normal rate and regular rhythm.     Heart sounds: Normal heart sounds, S1 normal and S2 normal. No murmur heard.   Pulmonary:     Effort: No respiratory distress.     Breath sounds: Normal breath sounds. No stridor. No wheezing or rales.  Lymphadenopathy:     Head:     Right side of head: No tonsillar adenopathy.     Left side of head: No tonsillar adenopathy.     Cervical: No cervical adenopathy.  Skin:    Findings: No erythema or rash.     Nails: There is no clubbing.  Neurological:     Mental Status: He is alert.     Diagnostics:    Spirometry was performed and demonstrated an FEV1 of 1.57 at 50 % of predicted.  The patient had an Asthma Control Test with the following results: ACT Total Score: 21.    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 Other allergic rhinitis   3. Laryngopharyngeal reflux (LPR)     1.  Continue to perform Allergen avoidance measures  2.  Continue to Treat and prevent inflammation:   A.  SAMPLE AirDuo 113 - 1 inhalation 1 time per day  B.  Fluticasone -1-2 sprays each nostril 1 time per day  C.  Montelukast 10 mg -1 tablet 1 time per day  3.  Continue to Treat and prevent reflux:   A.  Consolidate caffeine consumption    B.  Omeprazole 40 mg tablet 1 time per day  4.  If needed:   A.  Nasal saline spray  B.  OTC antihistamine  C.  Ventolin HFA or similar 2 inhalations every 4-6 hours  5.  Return to clinic in 6 months or earlier if problem  6. Obtain fall flu vaccine  I think that [name] would benefit from the use of a combination inhaler but it is a little bit too expensive for him to utilize this agent on a regular basis.  I have given him samples of a combination inhaler that should supply him for several months.  He can  always return to this clinic for more samples once he runs out.  He will remain on other anti-inflammatory agents for his airway as noted above and therapy directed against reflux.  I will see him back in his clinic in 6 months or earlier if there is a problem.  Allena Katz, MD Allergy / Immunology [place]

## 2019-12-03 NOTE — Patient Instructions (Addendum)
°  1.  Continue to perform Allergen avoidance measures  2.  Continue to Treat and prevent inflammation:   A.  SAMPLE AirDuo 113 - 1 inhalation 1 time per day  B.  Fluticasone -1-2 sprays each nostril 1 time per day  C.  Montelukast 10 mg -1 tablet 1 time per day  3.  Continue to Treat and prevent reflux:   A.  Consolidate caffeine consumption    B.  Omeprazole 40 mg tablet 1 time per day  4.  If needed:   A.  Nasal saline spray  B.  OTC antihistamine  C.  Ventolin HFA or similar 2 inhalations every 4-6 hours  5.  Return to clinic in 6 months or earlier if problem  6. Obtain fall flu vaccine

## 2019-12-04 ENCOUNTER — Encounter: Payer: Self-pay | Admitting: Allergy and Immunology

## 2020-01-29 ENCOUNTER — Other Ambulatory Visit: Payer: Self-pay

## 2020-01-29 ENCOUNTER — Telehealth: Payer: Self-pay | Admitting: Allergy and Immunology

## 2020-01-29 MED ORDER — AIRDUO DIGIHALER 113-14 MCG/ACT IN AEPB: 1.0000 | INHALATION_SPRAY | Freq: Every day | RESPIRATORY_TRACT | 3 refills | Status: DC

## 2020-01-29 NOTE — Telephone Encounter (Signed)
Patient would like to pick up a prescription for Air Duo inhaler to take to the [place] tomorrow. Also, patient would like a sample of air duo if possible until everything gets straightened out with the [place].  Please advise.

## 2020-01-29 NOTE — Telephone Encounter (Signed)
Sample given (ok per last office visit) and a printed rx for AirDuo. I did speak to patient and asked if he was given a coupon card and he stated he didn't get one. I informed patient that I would give him that he could use at [facility] and if it still did not go through then he could use the rx at the [place]. Patient expressed understanding.

## 2020-03-31 ENCOUNTER — Ambulatory Visit: Payer: Medicare Other | Attending: Internal Medicine

## 2020-03-31 DIAGNOSIS — Z23 Encounter for immunization: Secondary | ICD-10-CM

## 2020-03-31 NOTE — Progress Notes (Signed)
   Covid-19 Vaccination Clinic  Name:  Alexander Burns    MRN: [id number] DOB: 01-16-1951  03/31/2020  Alexander Burns was observed post Covid-19 immunization for 15 minutes without incident. He was provided with Vaccine Information Sheet and instruction to access the V-Safe system.   Alexander Burns was instructed to call 911 with any severe reactions post vaccine: Marland Kitchen Difficulty breathing  . Swelling of face and throat  . A fast heartbeat  . A bad rash all over body  . Dizziness and weakness

## 2020-05-19 ENCOUNTER — Ambulatory Visit: Payer: Medicare Other | Admitting: Allergy and Immunology

## 2020-05-19 ENCOUNTER — Other Ambulatory Visit: Payer: Self-pay | Admitting: Allergy and Immunology

## 2020-05-19 ENCOUNTER — Other Ambulatory Visit: Payer: Self-pay

## 2020-05-19 VITALS — BP 128/80 | HR 57 | Temp 97.2°F | Resp 14 | Ht 72.0 in | Wt 217.4 lb

## 2020-05-19 DIAGNOSIS — J3089 Other allergic rhinitis: Secondary | ICD-10-CM

## 2020-05-19 DIAGNOSIS — K219 Gastro-esophageal reflux disease without esophagitis: Secondary | ICD-10-CM | POA: Diagnosis not present

## 2020-05-19 DIAGNOSIS — J4540 Moderate persistent asthma, uncomplicated: Secondary | ICD-10-CM

## 2020-05-19 MED ORDER — AIRDUO DIGIHALER 113-14 MCG/ACT IN AEPB: 1.0000 | INHALATION_SPRAY | Freq: Every day | RESPIRATORY_TRACT | 3 refills | Status: DC

## 2020-05-19 MED ORDER — OMEPRAZOLE 40 MG PO CPDR: 40.0000 mg | DELAYED_RELEASE_CAPSULE | Freq: Every day | ORAL | 5 refills | Status: DC

## 2020-05-19 MED ORDER — BREO ELLIPTA 200-25 MCG/INH IN AEPB: 1.0000 | INHALATION_SPRAY | Freq: Every day | RESPIRATORY_TRACT | 3 refills | Status: DC

## 2020-05-19 MED ORDER — FLUTICASONE PROPIONATE 50 MCG/ACT NA SUSP: 1.0000 | Freq: Two times a day (BID) | NASAL | 5 refills | Status: DC | PRN

## 2020-05-19 MED ORDER — ALBUTEROL SULFATE HFA 108 (90 BASE) MCG/ACT IN AERS: 2.0000 | INHALATION_SPRAY | RESPIRATORY_TRACT | 3 refills | Status: DC | PRN

## 2020-05-19 MED ORDER — MONTELUKAST SODIUM 10 MG PO TABS: 10.0000 mg | ORAL_TABLET | Freq: Every day | ORAL | 1 refills | Status: DC

## 2020-05-19 NOTE — Patient Instructions (Signed)
  1.  Continue to perform Allergen avoidance measures  2.  Continue to Treat and prevent inflammation:   A.  AirDuo 113 - 1 inhalation 1-2 times per day  B.  Fluticasone -1-2 sprays each nostril 1 time per day  3.  Continue to Treat and prevent reflux:   A.  Consolidate caffeine consumption    B.  Omeprazole 40 mg tablet 1 time per day  4.  If needed:   A.  Nasal saline spray  B.  OTC antihistamine  C.  Ventolin HFA or similar 2 inhalations every 4-6 hours  5.  Return to clinic in 12 months or earlier if problem

## 2020-05-19 NOTE — Progress Notes (Signed)
- [place] [facility] [place]   Follow-up Note  Referring Provider: [name], MD Primary Provider: [name], MD Date of Office Visit: 05/19/2020  Subjective:   Alexander Burns (DOB: 06-21-1950) is a 69 y.o. male who returns to the [facility] [place] on 05/19/2020 in re-evaluation of the following:  HPI: [name] returns to this clinic in evaluation of asthma and allergic rhinitis and LPR.  His last visit to this clinic was 03 December 2019.  Overall he has really done very well and has not had any significant issues with either his upper or lower airway.  He has not required a systemic steroid or antibiotic for any type of airway issue.  He uses his air duo intermittently and he uses his nasal fluticasone intermittently.  Both of these medications result in very good control of his respiratory tract issue.  He has done so well over the course of the past several months that he has minimized the use of these medications.  Rarely does use a short acting bronchodilator.  He will be joining the [facility] sometime in the next month.  His reflux has been under very good control with intermittent use of omeprazole and Tums.  He has received 3 Moderna Covid vaccinations and the flu vaccine this year.  Allergies as of 05/19/2020   No Known Allergies     Medication List      AirDuo Digihaler 113-14 MCG/ACT Aepb Generic drug: Fluticasone-Salmeterol(sensor) Inhale 1 puff into the lungs daily.   albuterol 108 (90 Base) MCG/ACT inhaler Commonly known as: VENTOLIN HFA Inhale 2 puffs into the lungs every 4 (four) hours as needed for wheezing or shortness of breath.   cholecalciferol 1000 units tablet Commonly known as: VITAMIN D Take 1,000 Units by mouth every morning.   fluticasone 50 MCG/ACT nasal spray Commonly known as: FLONASE Place 1 spray into both nostrils 2 (two) times daily as needed for allergies or rhinitis.    losartan-hydrochlorothiazide 100-12.5 MG tablet Commonly known as: HYZAAR Take 1 tablet by mouth daily.   montelukast 10 MG tablet Commonly known as: SINGULAIR Take 1 tablet (10 mg total) by mouth at bedtime.   multivitamin with minerals tablet Take 1 tablet by mouth daily.   omeprazole 40 MG capsule Commonly known as: PRILOSEC Take 1 capsule (40 mg total) by mouth daily.       Past Medical History:  Diagnosis Date  . Asthma   . Bladder cancer ([place]) 2018  . Hypertension     Past Surgical History:  Procedure Laterality Date  . BACK SURGERY    . EUS N/A 11/14/2019   Procedure: LOWER ENDOSCOPIC ULTRASOUND (EUS);  Surgeon: Milus Banister, MD;  Location: [facility];  Service: Endoscopy;  Laterality: N/A;  . FLEXIBLE SIGMOIDOSCOPY N/A 11/14/2019   Procedure: FLEXIBLE SIGMOIDOSCOPY;  Surgeon: Milus Banister, MD;  Location: [facility];  Service: Endoscopy;  Laterality: N/A;  . FOOT SURGERY    . SUBMUCOSAL TATTOO INJECTION  11/14/2019   Procedure: SUBMUCOSAL TATTOO INJECTION;  Surgeon: Milus Banister, MD;  Location: [facility];  Service: Endoscopy;;  mini probe ultrasound  . TONSILLECTOMY      Review of systems negative except as noted in HPI / PMHx or noted below:  Review of Systems  Constitutional: Negative.   HENT: Negative.   Eyes: Negative.   Respiratory: Negative.   Cardiovascular: Negative.   Gastrointestinal: Negative.   Genitourinary: Negative.   Musculoskeletal: Negative.   Skin:  Negative.   Neurological: Negative.   Endo/Heme/Allergies: Negative.   Psychiatric/Behavioral: Negative.      Objective:   Vitals:   05/19/20 1104  BP: 128/80  Pulse: (!) 57  Resp: 14  Temp: (!) 97.2 F (36.2 C)  SpO2: 97%   Height: 6' (182.9 cm)  Weight: 217 lb 6.4 oz (98.6 kg)   Physical Exam Constitutional:      Appearance: He is not diaphoretic.  HENT:     Head: Normocephalic.     Right Ear: Tympanic membrane, ear canal and external ear normal.      Left Ear: Tympanic membrane, ear canal and external ear normal.     Nose: Nose normal. No mucosal edema or rhinorrhea.     Mouth/Throat:     Mouth: Oropharynx is clear and moist and mucous membranes are normal.     Pharynx: Uvula midline. No oropharyngeal exudate.  Eyes:     Conjunctiva/sclera: Conjunctivae normal.  Neck:     Thyroid: No thyromegaly.     Trachea: Trachea normal. No tracheal tenderness or tracheal deviation.  Cardiovascular:     Rate and Rhythm: Normal rate and regular rhythm.     Heart sounds: Normal heart sounds, S1 normal and S2 normal. No murmur heard.   Pulmonary:     Effort: No respiratory distress.     Breath sounds: Normal breath sounds. No stridor. No wheezing or rales.  Musculoskeletal:        General: No edema.  Lymphadenopathy:     Head:     Right side of head: No tonsillar adenopathy.     Left side of head: No tonsillar adenopathy.     Cervical: No cervical adenopathy.  Skin:    Findings: No erythema or rash.     Nails: There is no clubbing.  Neurological:     Mental Status: He is alert.     Diagnostics: none   Assessment and Plan:   1. Asthma, moderate persistent, well-controlled   2. Other allergic rhinitis   3. Laryngopharyngeal reflux (LPR)     1.  Continue to perform Allergen avoidance measures  2.  Continue to Treat and prevent inflammation:   A.  AirDuo 113 - 1 inhalation 1-2 times per day  B.  Fluticasone -1-2 sprays each nostril 1 time per day  3.  Continue to Treat and prevent reflux:   A.  Consolidate caffeine consumption    B.  Omeprazole 40 mg tablet 1 time per day  4.  If needed:   A.  Nasal saline spray  B.  OTC antihistamine  C.  Ventolin HFA or similar 2 inhalations every 4-6 hours  5.  Return to clinic in 12 months or earlier if problem  [place] is really doing very well.  He has a good understanding of his disease state and how his medications work and appropriate dosing of his medications depending on  disease activity.  Although he does not consistently use air duo or nasal fluticasone at this point he has no hesitation about starting these medications should he develop respiratory tract issues.  Usually during the spring he has a little bit of problem with both his upper and lower airway and he can certainly utilize these medications preventatively during that period in time.  Assuming he does well I will see him back in this clinic in 12 months or earlier if there is a problem.  Allena Katz, MD Allergy / Immunology [place]

## 2020-05-20 ENCOUNTER — Encounter: Payer: Self-pay | Admitting: Allergy and Immunology

## 2020-05-20 NOTE — Telephone Encounter (Signed)
Questionnaire completed approval pending.

## 2020-05-20 NOTE — Telephone Encounter (Signed)
PA initiated through [email or web address] for Airduo.

## 2020-05-21 NOTE — Telephone Encounter (Signed)
Pharmacy notified and will process.

## 2020-05-21 NOTE — Telephone Encounter (Signed)
Approvedon December 15 Request Reference Number: [id number]. AIRDUO [facility] INH 113-14 is approved through 06/05/2021. Your patient may now fill this prescription and it will be covered.

## 2021-05-06 ENCOUNTER — Ambulatory Visit: Payer: Medicare Other

## 2021-05-06 ENCOUNTER — Other Ambulatory Visit: Payer: Self-pay

## 2021-05-06 ENCOUNTER — Ambulatory Visit: Payer: Medicare Other | Admitting: Cardiology

## 2021-05-06 ENCOUNTER — Encounter: Payer: Self-pay | Admitting: Cardiology

## 2021-05-06 VITALS — BP 154/90 | HR 63 | Temp 98.0°F | Resp 16 | Ht 72.0 in | Wt 198.0 lb

## 2021-05-06 DIAGNOSIS — I200 Unstable angina: Secondary | ICD-10-CM

## 2021-05-06 DIAGNOSIS — R0602 Shortness of breath: Secondary | ICD-10-CM

## 2021-05-06 MED ORDER — AMLODIPINE BESYLATE 5 MG PO TABS
5.0000 mg | ORAL_TABLET | Freq: Every day | ORAL | 3 refills | Status: DC
Start: 2021-05-06 — End: 2021-05-20

## 2021-05-06 MED ORDER — ASPIRIN EC 81 MG PO TBEC
81.0000 mg | DELAYED_RELEASE_TABLET | Freq: Every day | ORAL | 3 refills | Status: DC
Start: 2021-05-06 — End: 2022-04-26

## 2021-05-06 MED ORDER — ATORVASTATIN CALCIUM 80 MG PO TABS
80.0000 mg | ORAL_TABLET | Freq: Every day | ORAL | 3 refills | Status: DC
Start: 2021-05-06 — End: 2021-05-07

## 2021-05-06 NOTE — Progress Notes (Signed)
Patient referred by [name], MD for shortness of breath  Subjective:   Alexander Burns, male    DOB: 1950/08/21, 70 y.o.   MRN: [id number]   Chief Complaint  Patient presents with   Shortness of Breath   New Patient (Initial Visit)     HPI  70 y.o. African-American male with hypertension, h/o bladder cancer, referred for relation of shortness of breath  Patient is a retired Clinical biochemist, lives with his wife.  He stays active with work around the house such as yard work, working on restoring his truck.  For last several months, he has had progressively worsening dyspnea with minimal exertion.  Now, he is short of breath with even stationary exercises.  For the last few days, he is also noticed feeling of constriction in his chest that seems to be present most of the day, and is worse with exertion and improved with rest.  Last week, he had few episodes of what he described as "panic attacks", while at rest, that was associated with tightness in his chest and shortness of breath.  He was also concerned about his low heart rate, but denies any presyncope or syncope. Blood pressure is elevated today.  Cholesterol is elevated.  He is not on any lipid-lowering agents.  Patient has family history of CAD, with his brother having had "blockages in his heart arteries" in his early 89s.   Past Medical History:  Diagnosis Date   Asthma    Bladder cancer ([place]) 2018   Hypertension      Past Surgical History:  Procedure Laterality Date   BACK SURGERY     EUS N/A 11/14/2019   Procedure: LOWER ENDOSCOPIC ULTRASOUND (EUS);  Surgeon: Milus Banister, MD;  Location: [facility];  Service: Endoscopy;  Laterality: N/A;   FLEXIBLE SIGMOIDOSCOPY N/A 11/14/2019   Procedure: FLEXIBLE SIGMOIDOSCOPY;  Surgeon: Milus Banister, MD;  Location: [facility];  Service: Endoscopy;  Laterality: N/A;   FOOT SURGERY     SUBMUCOSAL TATTOO INJECTION  11/14/2019   Procedure: SUBMUCOSAL TATTOO INJECTION;   Surgeon: Milus Banister, MD;  Location: [facility];  Service: Endoscopy;;  mini probe ultrasound   TONSILLECTOMY       Social History   Tobacco Use  Smoking Status Former   Packs/day: 1.00   Years: 3.00   Pack years: 3.00   Types: Cigarettes, Cigars   Quit date: 2000   Years since quitting: 22.9  Smokeless Tobacco Never    Social History   Substance and Sexual Activity  Alcohol Use Yes   Comment: socially     Family History  Problem Relation Age of Onset   Diabetes Mother    Heart disease Father    Diabetes Father    Diabetes Sister    Breast cancer Sister    Heart attack Brother 27       first heart attack   Asthma Maternal Grandmother    Diabetes Other    Hypertension Other    CAD Other      Current Outpatient Medications on File Prior to Visit  Medication Sig Dispense Refill   albuterol (VENTOLIN HFA) 108 (90 Base) MCG/ACT inhaler Inhale 2 puffs into the lungs every 4 (four) hours as needed for wheezing or shortness of breath. 18 g 3   cholecalciferol (VITAMIN D) 1000 UNITS tablet Take 1,000 Units by mouth every morning.      fluticasone (FLONASE) 50 MCG/ACT nasal spray Place 1 spray into both nostrils 2 (  two) times daily as needed for allergies or rhinitis. 16 g 5   Fluticasone-Salmeterol,sensor, (AIRDUO DIGIHALER) 113-14 MCG/ACT AEPB Inhale 1 puff into the lungs daily. 1 each 3   losartan-hydrochlorothiazide (HYZAAR) 100-12.5 MG tablet Take 1 tablet by mouth daily.      metoprolol tartrate (LOPRESSOR) 25 MG tablet TAKE ONE-HALF TABLET BY MOUTH TWICE A DAY FOR HEART     montelukast (SINGULAIR) 10 MG tablet Take 1 tablet (10 mg total) by mouth at bedtime. 90 tablet 1   Multiple Vitamins-Minerals (MULTIVITAMIN WITH MINERALS) tablet Take 1 tablet by mouth daily.     omeprazole (PRILOSEC) 40 MG capsule Take 1 capsule (40 mg total) by mouth daily. 30 capsule 5   sildenafil (VIAGRA) 100 MG tablet TAKE ONE-HALF TABLET BY MOUTH AS INSTRUCTED (TAKE 1 HOUR PRIOR TO  SEXUAL ACTIVITY *DO NOT EXCEED 1 DOSE PER 24 HOUR PERIOD*)//90-DAY SUPPLY     No current facility-administered medications on file prior to visit.    Cardiovascular and other pertinent studies:  EKG 05/06/2021: Sinus rhythm 57 bpm  LVH Old anteroseptal infarct Anterolateral and inferior T wave inversion, possible ischemia   Recent labs: 01/12/2021: Glucose 98, BUN/Cr 16/1.1. EGFR 80. Na/K 141/4.2. Rest of the CMP normal H/H 12.8/37.7. MCV 89. Platelets 138 HbA1C N/A Chol 213, TG 80, HDL 49, LDL 148 TSH 0.9 normal   Review of Systems  Cardiovascular:  Positive for chest pain and dyspnea on exertion. Negative for leg swelling, palpitations and syncope.  Respiratory:  Positive for shortness of breath.         Vitals:   05/06/21 0844 05/06/21 0849  BP: (!) 160/81 (!) 154/90  Pulse: (!) 57 63  Resp: 16   Temp: 98 F (36.7 C)   SpO2: 97%      Body mass index is 26.85 kg/m. Filed Weights   05/06/21 0844  Weight: 198 lb (89.8 kg)     Objective:   Physical Exam Vitals and nursing note reviewed.  Constitutional:      General: He is not in acute distress. Neck:     Vascular: No JVD.  Cardiovascular:     Rate and Rhythm: Normal rate and regular rhythm.     Heart sounds: Normal heart sounds. No murmur heard. Pulmonary:     Effort: Pulmonary effort is normal.     Breath sounds: Normal breath sounds. No wheezing or rales.  Musculoskeletal:     Right lower leg: No edema.     Left lower leg: No edema.        Assessment & Recommendations:   70 year old African-American male with uncontrolled hypertension, mixed hyperlipidemia, now with symptoms concerning for unstable angina.  Unstable angina: Symptoms very concerning for unstable angina given his chest tightness at rest, exertional dyspnea without heart failure signs on exam.  EKG shows deep anterolateral T wave inversions, concerning for ischemia. Recommend stat labs, including troponin.  Will obtain  echocardiogram today. Started aspirin 81 mg daily, amlodipine 5 mg daily, Lipitor 80 mg daily. Given his EKG changes and symptoms at rest, I do not think we should delay an angiogram by doing noninvasive tests first. I recommend urgent coronary angiography and possible intervention.  Thank you for referring the patient to [place]. Please feel free to contact with any questions.   Nigel Mormon, MD Pager: [phone] Office: [phone]

## 2021-05-07 ENCOUNTER — Ambulatory Visit (HOSPITAL_COMMUNITY)
Admission: RE | Admit: 2021-05-07 | Discharge: 2021-05-07 | Disposition: A | Discharge status: Home / Self Care | Payer: Medicare Other | Attending: Cardiology | Admitting: Cardiology

## 2021-05-07 ENCOUNTER — Other Ambulatory Visit (HOSPITAL_COMMUNITY): Payer: Self-pay

## 2021-05-07 ENCOUNTER — Encounter (HOSPITAL_COMMUNITY)
Admission: RE | Disposition: A | Discharge status: Home / Self Care | Payer: Self-pay | Source: Home / Self Care | Attending: Cardiology

## 2021-05-07 ENCOUNTER — Other Ambulatory Visit: Payer: Self-pay

## 2021-05-07 DIAGNOSIS — Z8249 Family history of ischemic heart disease and other diseases of the circulatory system: Secondary | ICD-10-CM | POA: Diagnosis not present

## 2021-05-07 DIAGNOSIS — E782 Mixed hyperlipidemia: Secondary | ICD-10-CM | POA: Insufficient documentation

## 2021-05-07 DIAGNOSIS — I25110 Atherosclerotic heart disease of native coronary artery with unstable angina pectoris: Secondary | ICD-10-CM | POA: Insufficient documentation

## 2021-05-07 DIAGNOSIS — I10 Essential (primary) hypertension: Secondary | ICD-10-CM | POA: Insufficient documentation

## 2021-05-07 DIAGNOSIS — I200 Unstable angina: Secondary | ICD-10-CM | POA: Diagnosis present

## 2021-05-07 DIAGNOSIS — I2510 Atherosclerotic heart disease of native coronary artery without angina pectoris: Secondary | ICD-10-CM

## 2021-05-07 DIAGNOSIS — Z955 Presence of coronary angioplasty implant and graft: Secondary | ICD-10-CM

## 2021-05-07 DIAGNOSIS — I25118 Atherosclerotic heart disease of native coronary artery with other forms of angina pectoris: Secondary | ICD-10-CM

## 2021-05-07 HISTORY — PX: CORONARY ULTRASOUND/IVUS: CATH118244

## 2021-05-07 HISTORY — PX: LEFT HEART CATH AND CORONARY ANGIOGRAPHY: CATH118249

## 2021-05-07 HISTORY — PX: CORONARY PRESSURE/FFR STUDY: CATH118243

## 2021-05-07 HISTORY — PX: CORONARY STENT INTERVENTION: CATH118234

## 2021-05-07 LAB — BASIC METABOLIC PANEL
Anion gap: 3 — ABNORMAL LOW (ref 5–15)
BUN/Creatinine Ratio: 12 (ref 10–24)
BUN: 13 mg/dL (ref 8–27)
BUN: 14 mg/dL (ref 8–23)
CO2: 26 mmol/L (ref 20–29)
CO2: 29 mmol/L (ref 22–32)
Calcium: 9.8 mg/dL (ref 8.6–10.2)
Calcium: 9.3 mg/dL (ref 8.9–10.3)
Chloride: 102 mmol/L (ref 96–106)
Chloride: 108 mmol/L (ref 98–111)
Creatinine, Ser: 1.09 mg/dL (ref 0.76–1.27)
Creatinine, Ser: 1.08 mg/dL (ref 0.61–1.24)
GFR, Estimated: >60 mL/min (ref >60)
Glucose, Bld: 107 mg/dL — ABNORMAL HIGH (ref 70–99)
Glucose: 95 mg/dL (ref 70–99)
Potassium: 4.4 mmol/L (ref 3.5–5.2)
Potassium: 3.9 mmol/L (ref 3.5–5.1)
Sodium: 140 mmol/L (ref 134–144)
Sodium: 140 mmol/L (ref 135–145)
eGFR: 73 mL/min/{1.73_m2} (ref >59)

## 2021-05-07 LAB — CBC
HCT: 43.2 % (ref 39.0–52.0)
Hematocrit: 44.8 % (ref 37.5–51.0)
Hemoglobin: 14.9 g/dL (ref 13.0–17.7)
Hemoglobin: 14.3 g/dL (ref 13.0–17.0)
MCH: 29.9 pg (ref 26.6–33.0)
MCH: 30.4 pg (ref 26.0–34.0)
MCHC: 33.3 g/dL (ref 31.5–35.7)
MCHC: 33.1 g/dL (ref 30.0–36.0)
MCV: 90 fL (ref 79–97)
MCV: 91.9 fL (ref 80.0–100.0)
Platelets: 190 10*3/uL (ref 150–450)
Platelets: 188 10*3/uL (ref 150–400)
RBC: 4.99 x10E6/uL (ref 4.14–5.80)
RBC: 4.7 MIL/uL (ref 4.22–5.81)
RDW: 12.4 % (ref 11.6–15.4)
RDW: 12.5 % (ref 11.5–15.5)
WBC: 4.2 10*3/uL (ref 3.4–10.8)
WBC: 4 10*3/uL (ref 4.0–10.5)
nRBC: 0 % (ref 0.0–0.2)

## 2021-05-07 LAB — TROPONIN T: Troponin T (Highly Sensitive): 12 ng/L (ref 0–22)

## 2021-05-07 LAB — POCT ACTIVATED CLOTTING TIME
Activated Clotting Time: 348 seconds
Activated Clotting Time: 275 seconds
Activated Clotting Time: 305 seconds

## 2021-05-07 LAB — BRAIN NATRIURETIC PEPTIDE: B Natriuretic Peptide: 24.5 pg/mL (ref 0.0–100.0)

## 2021-05-07 SURGERY — LEFT HEART CATH AND CORONARY ANGIOGRAPHY
Anesthesia: LOCAL

## 2021-05-07 MED ORDER — VERAPAMIL HCL 2.5 MG/ML IV SOLN
INTRAVENOUS | Status: DC | PRN
  Administered 2021-05-07: 10 mL via INTRA_ARTERIAL

## 2021-05-07 MED ORDER — ATORVASTATIN CALCIUM 80 MG PO TABS: 80.0000 mg | ORAL_TABLET | Freq: Every day | ORAL | 1 refills | Status: DC

## 2021-05-07 MED ORDER — HEPARIN SODIUM (PORCINE) 1000 UNIT/ML IJ SOLN
INTRAMUSCULAR | Status: AC
  Filled 2021-05-07: qty 10

## 2021-05-07 MED ORDER — NITROGLYCERIN 1 MG/10 ML FOR IR/CATH LAB
INTRA_ARTERIAL | Status: DC | PRN
  Administered 2021-05-07: 200 ug via INTRACORONARY

## 2021-05-07 MED ORDER — SODIUM CHLORIDE 0.9% FLUSH: 3.0000 mL | Freq: Two times a day (BID) | INTRAVENOUS | Status: DC

## 2021-05-07 MED ORDER — HEPARIN (PORCINE) IN NACL 1000-0.9 UT/500ML-% IV SOLN
INTRAVENOUS | Status: AC
  Filled 2021-05-07: qty 500

## 2021-05-07 MED ORDER — FENTANYL CITRATE (PF) 100 MCG/2ML IJ SOLN
INTRAMUSCULAR | Status: AC
  Filled 2021-05-07: qty 2

## 2021-05-07 MED ORDER — LIDOCAINE HCL (PF) 1 % IJ SOLN
INTRAMUSCULAR | Status: AC
  Filled 2021-05-07: qty 30

## 2021-05-07 MED ORDER — SODIUM CHLORIDE 0.9 % IV SOLN: 250.0000 mL | INTRAVENOUS | Status: DC | PRN

## 2021-05-07 MED ORDER — VERAPAMIL HCL 2.5 MG/ML IV SOLN
INTRAVENOUS | Status: AC
  Filled 2021-05-07: qty 2

## 2021-05-07 MED ORDER — CLOPIDOGREL BISULFATE 75 MG PO TABS: 75.0000 mg | ORAL_TABLET | Freq: Every day | ORAL | Status: DC

## 2021-05-07 MED ORDER — MIDAZOLAM HCL 2 MG/2ML IJ SOLN
INTRAMUSCULAR | Status: AC
  Filled 2021-05-07: qty 2

## 2021-05-07 MED ORDER — HEPARIN SODIUM (PORCINE) 1000 UNIT/ML IJ SOLN
INTRAMUSCULAR | Status: DC | PRN
  Administered 2021-05-07: 2000 [IU] via INTRAVENOUS
  Administered 2021-05-07 (×2): 4500 [IU] via INTRAVENOUS

## 2021-05-07 MED ORDER — LABETALOL HCL 5 MG/ML IV SOLN: 10.0000 mg | INTRAVENOUS | Status: AC | PRN

## 2021-05-07 MED ORDER — SODIUM CHLORIDE 0.9 % IV SOLN: INTRAVENOUS | Status: AC

## 2021-05-07 MED ORDER — LIDOCAINE HCL (PF) 1 % IJ SOLN
INTRAMUSCULAR | Status: DC | PRN
  Administered 2021-05-07: 2 mL

## 2021-05-07 MED ORDER — SODIUM CHLORIDE 0.9% FLUSH: 3.0000 mL | INTRAVENOUS | Status: DC | PRN

## 2021-05-07 MED ORDER — IOHEXOL 350 MG/ML SOLN
INTRAVENOUS | Status: DC | PRN
  Administered 2021-05-07: 180 mL

## 2021-05-07 MED ORDER — ACETAMINOPHEN 325 MG PO TABS: 650.0000 mg | ORAL_TABLET | ORAL | Status: DC | PRN

## 2021-05-07 MED ORDER — CLOPIDOGREL BISULFATE 300 MG PO TABS
ORAL_TABLET | ORAL | Status: DC | PRN
  Administered 2021-05-07: 600 mg via ORAL

## 2021-05-07 MED ORDER — HYDRALAZINE HCL 20 MG/ML IJ SOLN: 10.0000 mg | INTRAMUSCULAR | Status: AC | PRN

## 2021-05-07 MED ORDER — CLOPIDOGREL BISULFATE 300 MG PO TABS
ORAL_TABLET | ORAL | Status: AC
  Filled 2021-05-07: qty 2

## 2021-05-07 MED ORDER — PANTOPRAZOLE SODIUM 20 MG PO TBEC
20.0000 mg | DELAYED_RELEASE_TABLET | Freq: Every day | ORAL | 1 refills | Status: DC
  Filled 2021-05-07: qty 30, 30d supply, fill #0

## 2021-05-07 MED ORDER — FENTANYL CITRATE (PF) 100 MCG/2ML IJ SOLN
INTRAMUSCULAR | Status: DC | PRN
  Administered 2021-05-07: 50 ug via INTRAVENOUS

## 2021-05-07 MED ORDER — SODIUM CHLORIDE 0.9 % WEIGHT BASED INFUSION
3.0000 mL/kg/h | INTRAVENOUS | Status: AC
  Administered 2021-05-07: 3 mL/kg/h via INTRAVENOUS

## 2021-05-07 MED ORDER — HEPARIN (PORCINE) IN NACL 1000-0.9 UT/500ML-% IV SOLN
INTRAVENOUS | Status: DC | PRN
  Administered 2021-05-07 (×2): 500 mL

## 2021-05-07 MED ORDER — SODIUM CHLORIDE 0.9 % WEIGHT BASED INFUSION: 1.0000 mL/kg/h | INTRAVENOUS | Status: DC

## 2021-05-07 MED ORDER — MIDAZOLAM HCL 2 MG/2ML IJ SOLN
INTRAMUSCULAR | Status: DC | PRN
  Administered 2021-05-07: 2 mg via INTRAVENOUS

## 2021-05-07 MED ORDER — CLOPIDOGREL BISULFATE 75 MG PO TABS
75.0000 mg | ORAL_TABLET | Freq: Every day | ORAL | 1 refills | Status: DC
  Filled 2021-05-07: qty 30, 30d supply, fill #0
  Filled 2021-06-01: qty 30, 30d supply, fill #1
  Filled 2021-06-02: qty 30, 30d supply, fill #0

## 2021-05-07 MED ORDER — ASPIRIN 81 MG PO CHEW
81.0000 mg | CHEWABLE_TABLET | ORAL | Status: AC
  Administered 2021-05-07: 81 mg via ORAL
  Filled 2021-05-07: qty 1

## 2021-05-07 MED ORDER — NITROGLYCERIN 1 MG/10 ML FOR IR/CATH LAB
INTRA_ARTERIAL | Status: AC
  Filled 2021-05-07: qty 10

## 2021-05-07 MED ORDER — ONDANSETRON HCL 4 MG/2ML IJ SOLN: 4.0000 mg | Freq: Four times a day (QID) | INTRAMUSCULAR | Status: DC | PRN

## 2021-05-07 SURGICAL SUPPLY — 24 items
BALLN SAPPHIRE ~~LOC~~ 3.75X15 (BALLOONS) ×2 IMPLANT
BALLN SAPPHIRE ~~LOC~~ 4.0X12 (BALLOONS) ×4 IMPLANT
BALLN SCOREFLEX 3.50X15 (BALLOONS) ×2
BALLOON SCOREFLEX 3.50X15 (BALLOONS) ×1 IMPLANT
CATH 5FR JL3.5 JR4 ANG PIG MP (CATHETERS) ×2 IMPLANT
CATH LAUNCHER 6FR EBU 3 (CATHETERS) ×2 IMPLANT
CATH OPTICROSS HD (CATHETERS) ×2 IMPLANT
DEVICE RAD COMP TR BAND LRG (VASCULAR PRODUCTS) ×2 IMPLANT
GLIDESHEATH SLEND A-KIT 6F 22G (SHEATH) ×2 IMPLANT
GUIDEWIRE INQWIRE 1.5J.035X260 (WIRE) ×1 IMPLANT
GUIDEWIRE PRESSURE X 175 (WIRE) ×2 IMPLANT
INQWIRE 1.5J .035X260CM (WIRE) ×2
KIT ENCORE 26 ADVANTAGE (KITS) ×2 IMPLANT
KIT HEART LEFT (KITS) ×2 IMPLANT
KIT HEMO VALVE WATCHDOG (MISCELLANEOUS) ×2 IMPLANT
PACK CARDIAC CATHETERIZATION (CUSTOM PROCEDURE TRAY) ×2 IMPLANT
SHEATH PROBE COVER 6X72 (BAG) ×2 IMPLANT
SLED PULL BACK IVUS (MISCELLANEOUS) ×2 IMPLANT
STENT SYNERGY XD 3.50X16 (Permanent Stent) ×1 IMPLANT
STENT SYNERGY XD 3.50X20 (Permanent Stent) ×1 IMPLANT
SYNERGY XD 3.50X16 (Permanent Stent) ×2 IMPLANT
SYNERGY XD 3.50X20 (Permanent Stent) ×2 IMPLANT
TRANSDUCER W/STOPCOCK (MISCELLANEOUS) ×2 IMPLANT
TUBING CIL FLEX 10 FLL-RA (TUBING) ×2 IMPLANT

## 2021-05-07 NOTE — Discharge Summary (Signed)
Physician Discharge Summary  Patient ID: Alexander Burns MRN: [id number] DOB/AGE: 06-29-1950 70 y.o.  Admit date: 05/07/2021 Discharge date: 05/07/2021  Primary Discharge Diagnosis: Coronary artery disease  Secondary Discharge Diagnosis: Hypertension Mixed hyperlipidemia Family h/o CAD   Hospital Course:   70 y/o [place] American male with hypertension, mixed hyperlipidemia, family h/o CAD, angina with minimal activity, as well as rest, T wave inversion on resting EKG, concern for unstable angina  Coronary angiogram showed mid LAD lesions with physiologically abnormal RFR.  IVUS MLA was 3.7 mm.  I am not convinced that Donnetta Hail can be explained by this, patient has been asymptomatic.  Therefore, I decided to proceed with PCI, in addition to ongoing medical therapy.  See details below  Patient ambulated after PCI with no chest pain or shortness of breath symptoms.  On a separate note, his echocardiogram does show moderate asymmetric hypertrophy, albeit without any LVOT obstruction.  Given that his 70, his risk of sudden death from even hypertrophic cardiomyopathy is pretty low.  We will follow clinically.  We will repeat echocardiogram in 3 months.  In future, if he has recurrent dyspnea symptoms, will consider cardiac MRI for further work-up.  Discharge Exam: Blood pressure (!) 152/79, pulse (!) 44, temperature 98.8 F (37.1 C), temperature source Oral, resp. rate (!) 38, height 6' (1.829 m), weight 91.6 kg, SpO2 100 %.   Physical Exam Vitals and nursing note reviewed.  Constitutional:      General: He is not in acute distress.    Appearance: He is well-developed.  HENT:     Head: Normocephalic and atraumatic.  Eyes:     Conjunctiva/sclera: Conjunctivae normal.     Pupils: Pupils are equal, round, and reactive to light.  Neck:     Vascular: No JVD.  Cardiovascular:     Rate and Rhythm: Normal rate and regular rhythm.     Pulses: Normal pulses and intact distal pulses.      Heart sounds: No murmur heard. Pulmonary:     Effort: Pulmonary effort is normal.     Breath sounds: Normal breath sounds. No wheezing or rales.  Abdominal:     General: Bowel sounds are normal.     Palpations: Abdomen is soft.     Tenderness: There is no rebound.  Musculoskeletal:        General: No tenderness. Normal range of motion.     Right lower leg: No edema.     Left lower leg: No edema.  Lymphadenopathy:     Cervical: No cervical adenopathy.  Skin:    General: Skin is warm and dry.  Neurological:     Mental Status: He is alert and oriented to person, place, and time.     Cranial Nerves: No cranial nerve deficit.     Significant Diagnostic Studies:  EKG 05/07/2021: Sinus rhythm LVH T wave inversion likely due to LVH  Echocardiogram 05/06/2021:  Normal LV systolic function with EF 61%. Left ventricle cavity is normal  in size. 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 Septum  measures 1.7 cm. Normal global wall motion. Doppler evidence of grade II  (pseudonormal) diastolic dysfunction, elevated LAP.  Left atrial cavity is mildly dilated.  Moderate (Grade II) mitral regurgitation.  Mild to moderate tricuspid regurgitation. Estimated pulmonary artery  systolic pressure 26 mmHg.  Coronary intervention 12/1/202: LM: Normal LAD: Mid LAD 75-80% stenoses. RFR 0.89 Lcx: No significant disease RCA: No significant disease   Successful percutaneous coronary intervention mid LAD (non-overlapping stents)  PTCA and stent placement 3.5 X 20 mm Synergy drug-eluting stent, post dilatation with 3.75 mm [place]        PTCA and stent placement 4.0 X 16 mm Synergy drug-eluting stent, post dilatation with 4.0 mm [place]   Post PCI RFR 0.93  Labs:   Lab Results  Component Value Date   WBC 4.0 05/07/2021   HGB 14.3 05/07/2021   HCT 43.2 05/07/2021   MCV 91.9 05/07/2021   PLT 188 05/07/2021    Recent Labs  Lab 05/07/21 0537  NA 140  K 3.9  CL 108  CO2 29   BUN 14  CREATININE 1.08  CALCIUM 9.3  GLUCOSE 107*    Lipid Panel  No results found for: CHOL, TRIG, HDL, CHOLHDL, VLDL, LDLCALC  BNP (last 3 results) Recent Labs    05/07/21 0536  BNP 24.5    HEMOGLOBIN A1C No results found for: HGBA1C, MPG  Cardiac Panel (last 3 results) No results for input(s): CKTOTAL, CKMB, TROPONINI, RELINDX in the last 8760 hours.  Lab Results  Component Value Date   TROPONINI <0.30 05/06/2014     TSH No results for input(s): TSH in the last 8760 hours.  Radiology: CARDIAC CATHETERIZATION  Result Date: 05/07/2021 Images from the original result were not included. LM: Normal LAD: Mid LAD 75-80% stenoses. RFR 0.89 Lcx: No significant disease RCA: No significant disease Successful percutaneous coronary intervention mid LAD (non-overlapping stents)        PTCA and stent placement 3.5 X 20 mm Synergy drug-eluting stent, post dilatation with 3.75 mm [place]        PTCA and stent placement 4.0 X 16 mm Synergy drug-eluting stent, post dilatation with 4.0 mm [place] Post PCI RFR 0.93 Nigel Mormon, MD Pager: [phone] Office: [phone]  PCV ECHOCARDIOGRAM COMPLETE  Result Date: 05/07/2021 Echocardiogram 05/06/2021: Normal LV systolic function with EF 61%. Left ventricle cavity is normal in size. 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 Septum measures 1.7 cm. Normal global wall motion. Doppler evidence of grade II (pseudonormal) diastolic dysfunction, elevated LAP. Left atrial cavity is mildly dilated. Moderate (Grade II) mitral regurgitation. Mild to moderate tricuspid regurgitation. Estimated pulmonary artery systolic pressure 26 mmHg.     FOLLOW UP PLANS AND APPOINTMENTS Discharge Instructions     Amb Referral to Cardiac Rehabilitation   Complete by: As directed    Diagnosis:  Coronary Stents PTCA     After initial evaluation and assessments completed: Virtual Based Care may be provided alone or in conjunction with Phase 2 Cardiac  Rehab based on patient barriers.: Yes   Diet - low sodium heart healthy   Complete by: As directed    Increase activity slowly   Complete by: As directed       Allergies as of 05/07/2021       Reactions   Nabumetone Hives, Rash        Medication List     STOP taking these medications    omeprazole 40 MG capsule Commonly known as: PRILOSEC       TAKE these medications    AirDuo Digihaler 113-14 MCG/ACT Aepb Generic drug: Fluticasone-Salmeterol(sensor) Inhale 1 puff into the lungs daily.   albuterol 108 (90 Base) MCG/ACT inhaler Commonly known as: VENTOLIN HFA Inhale 2 puffs into the lungs every 4 (four) hours as needed for wheezing or shortness of breath.   amLODipine 5 MG tablet Commonly known as: NORVASC Take 1 tablet (5 mg total) by mouth daily.   aspirin EC 81  MG tablet Take 1 tablet (81 mg total) by mouth daily. Swallow whole.   atorvastatin 80 MG tablet Commonly known as: LIPITOR Take 1 tablet (80 mg total) by mouth daily.   cholecalciferol 1000 units tablet Commonly known as: VITAMIN D Take 1,000 Units by mouth every morning.   clopidogrel 75 MG tablet Commonly known as: Plavix Take 1 tablet (75 mg total) by mouth daily.   fluticasone 50 MCG/ACT nasal spray Commonly known as: FLONASE Place 1 spray into both nostrils 2 (two) times daily as needed for allergies or rhinitis.   losartan-hydrochlorothiazide 100-12.5 MG tablet Commonly known as: HYZAAR Take 1 tablet by mouth daily.   metoprolol tartrate 25 MG tablet Commonly known as: LOPRESSOR Take 25 mg by mouth 2 (two) times daily.   montelukast 10 MG tablet Commonly known as: SINGULAIR Take 1 tablet (10 mg total) by mouth at bedtime.   multivitamin with minerals tablet Take 1 tablet by mouth daily.   pantoprazole 20 MG tablet Commonly known as: Protonix Take 1 tablet (20 mg total) by mouth daily.        Follow-up Information     Alethia Berthold, PA-C Follow up on 05/20/2021.    Specialty: Cardiology Why: 9:30 AM Contact information: [place] [address] [phone]                   Nigel Mormon, MD Pager: [phone] Office: [phone]

## 2021-05-07 NOTE — Progress Notes (Signed)
Discussed stent, Plavix, restrictions, diet, exercise, statins, NTG, and CRPII. Pt receptive, has questions regarding statins. Will refer to [place]. Understands importance of Plavix 1050-1115 Yves Dill CES, ACSM 11:16 AM 05/07/2021

## 2021-05-07 NOTE — H&P (Signed)
OV 05/06/2021 copied for documentation     Patient referred by [name], MD for shortness of breath  Subjective:   Alexander Burns, male    DOB: 04/30/1951, 70 y.o.   MRN: [id number]   Chief Complaint  Patient presents with   Shortness of Breath   New Patient (Initial Visit)     HPI  70 y.o. African-American male with hypertension, h/o bladder cancer, referred for relation of shortness of breath  Patient is a retired Clinical biochemist, lives with his wife.  He stays active with work around the house such as yard work, working on restoring his truck.  For last several months, he has had progressively worsening dyspnea with minimal exertion.  Now, he is short of breath with even stationary exercises.  For the last few days, he is also noticed feeling of constriction in his chest that seems to be present most of the day, and is worse with exertion and improved with rest.  Last week, he had few episodes of what he described as "panic attacks", while at rest, that was associated with tightness in his chest and shortness of breath.  He was also concerned about his low heart rate, but denies any presyncope or syncope. Blood pressure is elevated today.  Cholesterol is elevated.  He is not on any lipid-lowering agents.  Patient has family history of CAD, with his brother having had "blockages in his heart arteries" in his early 59s.   Past Medical History:  Diagnosis Date   Asthma    Bladder cancer ([place]) 2018   Hypertension      Past Surgical History:  Procedure Laterality Date   BACK SURGERY     EUS N/A 11/14/2019   Procedure: LOWER ENDOSCOPIC ULTRASOUND (EUS);  Surgeon: Milus Banister, MD;  Location: [facility];  Service: Endoscopy;  Laterality: N/A;   FLEXIBLE SIGMOIDOSCOPY N/A 11/14/2019   Procedure: FLEXIBLE SIGMOIDOSCOPY;  Surgeon: Milus Banister, MD;  Location: [facility];  Service: Endoscopy;  Laterality: N/A;   FOOT SURGERY     SUBMUCOSAL TATTOO INJECTION  11/14/2019    Procedure: SUBMUCOSAL TATTOO INJECTION;  Surgeon: Milus Banister, MD;  Location: [facility];  Service: Endoscopy;;  mini probe ultrasound   TONSILLECTOMY       Social History   Tobacco Use  Smoking Status Former   Packs/day: 1.00   Years: 3.00   Pack years: 3.00   Types: Cigarettes, Cigars   Quit date: 2000   Years since quitting: 22.9  Smokeless Tobacco Never    Social History   Substance and Sexual Activity  Alcohol Use Yes   Comment: socially     Family History  Problem Relation Age of Onset   Diabetes Mother    Heart disease Father    Diabetes Father    Diabetes Sister    Breast cancer Sister    Heart attack Brother 81       first heart attack   Asthma Maternal Grandmother    Diabetes Other    Hypertension Other    CAD Other      Current Outpatient Medications on File Prior to Visit  Medication Sig Dispense Refill   albuterol (VENTOLIN HFA) 108 (90 Base) MCG/ACT inhaler Inhale 2 puffs into the lungs every 4 (four) hours as needed for wheezing or shortness of breath. 18 g 3   cholecalciferol (VITAMIN D) 1000 UNITS tablet Take 1,000 Units by mouth every morning.      fluticasone (FLONASE) 50 MCG/ACT nasal spray  Place 1 spray into both nostrils 2 (two) times daily as needed for allergies or rhinitis. 16 g 5   Fluticasone-Salmeterol,sensor, (AIRDUO DIGIHALER) 113-14 MCG/ACT AEPB Inhale 1 puff into the lungs daily. 1 each 3   losartan-hydrochlorothiazide (HYZAAR) 100-12.5 MG tablet Take 1 tablet by mouth daily.      metoprolol tartrate (LOPRESSOR) 25 MG tablet TAKE ONE-HALF TABLET BY MOUTH TWICE A DAY FOR HEART     montelukast (SINGULAIR) 10 MG tablet Take 1 tablet (10 mg total) by mouth at bedtime. 90 tablet 1   Multiple Vitamins-Minerals (MULTIVITAMIN WITH MINERALS) tablet Take 1 tablet by mouth daily.     omeprazole (PRILOSEC) 40 MG capsule Take 1 capsule (40 mg total) by mouth daily. 30 capsule 5   sildenafil (VIAGRA) 100 MG tablet TAKE ONE-HALF TABLET BY  MOUTH AS INSTRUCTED (TAKE 1 HOUR PRIOR TO SEXUAL ACTIVITY *DO NOT EXCEED 1 DOSE PER 24 HOUR PERIOD*)//90-DAY SUPPLY     No current facility-administered medications on file prior to visit.    Cardiovascular and other pertinent studies:  EKG 05/06/2021: Sinus rhythm 57 bpm  LVH Old anteroseptal infarct Anterolateral and inferior T wave inversion, possible ischemia   Recent labs: 01/12/2021: Glucose 98, BUN/Cr 16/1.1. EGFR 80. Na/K 141/4.2. Rest of the CMP normal H/H 12.8/37.7. MCV 89. Platelets 138 HbA1C N/A Chol 213, TG 80, HDL 49, LDL 148 TSH 0.9 normal   Review of Systems  Cardiovascular:  Positive for chest pain and dyspnea on exertion. Negative for leg swelling, palpitations and syncope.  Respiratory:  Positive for shortness of breath.         Vitals:   05/06/21 0844 05/06/21 0849  BP: (!) 160/81 (!) 154/90  Pulse: (!) 57 63  Resp: 16   Temp: 98 F (36.7 C)   SpO2: 97%      Body mass index is 26.85 kg/m. Filed Weights   05/06/21 0844  Weight: 198 lb (89.8 kg)     Objective:   Physical Exam Vitals and nursing note reviewed.  Constitutional:      General: He is not in acute distress. Neck:     Vascular: No JVD.  Cardiovascular:     Rate and Rhythm: Normal rate and regular rhythm.     Heart sounds: Normal heart sounds. No murmur heard. Pulmonary:     Effort: Pulmonary effort is normal.     Breath sounds: Normal breath sounds. No wheezing or rales.  Musculoskeletal:     Right lower leg: No edema.     Left lower leg: No edema.        Assessment & Recommendations:   70 year old African-American male with uncontrolled hypertension, mixed hyperlipidemia, now with symptoms concerning for unstable angina.  Unstable angina: Symptoms very concerning for unstable angina given his chest tightness at rest, exertional dyspnea without heart failure signs on exam.  EKG shows deep anterolateral T wave inversions, concerning for ischemia. Recommend stat labs,  including troponin.  Will obtain echocardiogram today. Started aspirin 81 mg daily, amlodipine 5 mg daily, Lipitor 80 mg daily. Given his EKG changes and symptoms at rest, I do not think we should delay an angiogram by doing noninvasive tests first. I recommend urgent coronary angiography and possible intervention.  Thank you for referring the patient to [place]. Please feel free to contact with any questions.   Nigel Mormon, MD Pager: [phone] Office: [phone]

## 2021-05-07 NOTE — Progress Notes (Signed)
Pt ambulated without difficulty or bleeding.   Discharged home with his wife who will drive and stay with pt x 24 hrs.  Pt seen by rehab, pharmacy, and Dr. Virgina Jock prior to DC. AVS reviewed with pt and wife and pt verbalized understanding.

## 2021-05-07 NOTE — CV Procedure (Signed)
RFR and IVUS guided PCI, two stents in mid LAD Same day discharge   Nigel Mormon, MD Pager: [phone] Office: [phone]

## 2021-05-07 NOTE — Interval H&P Note (Signed)
History and Physical Interval Note:  05/07/2021 7:37 AM  Alexander Burns  has presented today for surgery, with the diagnosis of heart failure, shortness of breath.  The various methods of treatment have been discussed with the patient and family. After consideration of risks, benefits and other options for treatment, the patient has consented to  Procedure(s): LEFT HEART CATH AND CORONARY ANGIOGRAPHY (N/A) as a surgical intervention.  The patient's history has been reviewed, patient examined, no change in status, stable for surgery.  I have reviewed the patient's chart and labs.  Questions were answered to the patient's satisfaction.    2016 Appropriate Use Criteria for Coronary Revascularization in Patients With Acute Coronary Syndrome NSTEMI/Unstable angina, stabilized patient at Intermediate Risk (TIMI Score 3-4) Indication:  Revascularization by PCI or CABG of 1 or more arteries in a patient with NSTEMI or unstable angina with Stabilization after presentation Intermediate risk for clinical events A (7) Indication: 16; Score 7 [employer]

## 2021-05-10 ENCOUNTER — Encounter (HOSPITAL_COMMUNITY): Payer: Self-pay | Admitting: Cardiology

## 2021-05-12 ENCOUNTER — Telehealth (HOSPITAL_COMMUNITY): Payer: Self-pay

## 2021-05-12 NOTE — Telephone Encounter (Signed)
Attempted to call patient in regards to Cardiac Rehab - LM on VM 

## 2021-05-12 NOTE — Telephone Encounter (Signed)
Will check insurance benefits closer to scheduling and/or into the new year 2023. 

## 2021-05-18 NOTE — Progress Notes (Signed)
Patient referred by Alexander Hatchet, MD for shortness of breath  Subjective:   Alexander Burns, male    DOB: 1951-02-01, 70 y.o.   MRN: [id number]   Chief Complaint  Patient presents with   Shortness of Breath   Follow-up   Headache     HPI  70 y.o. African-American male with hypertension, h/o bladder cancer, CAD, hyperlipidemia, family history of CAD.   Patient reports he has had complete resolution of anginal symptoms since discharge from the hospital.  He is presently taking aspirin and Plavix, losartan/hydrochlorothiazide, and amlodipine.  He refuses to take statin therapy. He has been resistant to metoprolol as well as he "will not take so many medicines".   He denies chest pain, dyspnea.  Denies palpitations, syncope, near syncope, orthopnea, PND, edema.  Discharge summary via Dr. Virgina Jock 05/07/2021: Presented 05/06/2021 with angina with minimal activity, as well as rest, T wave inversion on resting EKG, concern for unstable angina   Coronary angiogram showed mid LAD lesions with physiologically abnormal RFR.  IVUS MLA was 3.7 mm.  I am not convinced that Donnetta Hail can be explained by this, patient has been asymptomatic.  Therefore, I decided to proceed with PCI, in addition to ongoing medical therapy.  See details below   Patient ambulated after PCI with no chest pain or shortness of breath symptoms.   On a separate note, his echocardiogram does show moderate asymmetric hypertrophy, albeit without any LVOT obstruction.  Given that his 70, his risk of sudden death from even hypertrophic cardiomyopathy is pretty low.  We will follow clinically.  We will repeat echocardiogram in 3 months.  In future, if he has recurrent dyspnea symptoms, will consider cardiac MRI for further work-up.  Past Medical History:  Diagnosis Date   Asthma    Bladder cancer ([place]) 2018   Hypertension    Past Surgical History:  Procedure Laterality Date   BACK SURGERY     CORONARY STENT  INTERVENTION N/A 05/07/2021   Procedure: CORONARY STENT INTERVENTION;  Surgeon: Nigel Mormon, MD;  Location: [facility];  Service: Cardiovascular;  Laterality: N/A;   EUS N/A 11/14/2019   Procedure: LOWER ENDOSCOPIC ULTRASOUND (EUS);  Surgeon: Milus Banister, MD;  Location: [facility];  Service: Endoscopy;  Laterality: N/A;   FLEXIBLE SIGMOIDOSCOPY N/A 11/14/2019   Procedure: FLEXIBLE SIGMOIDOSCOPY;  Surgeon: Milus Banister, MD;  Location: [facility];  Service: Endoscopy;  Laterality: N/A;   FOOT SURGERY     INTRAVASCULAR PRESSURE WIRE/FFR STUDY N/A 05/07/2021   Procedure: INTRAVASCULAR PRESSURE WIRE/FFR STUDY;  Surgeon: Nigel Mormon, MD;  Location: [facility];  Service: Cardiovascular;  Laterality: N/A;   INTRAVASCULAR ULTRASOUND/IVUS N/A 05/07/2021   Procedure: Intravascular Ultrasound/IVUS;  Surgeon: Nigel Mormon, MD;  Location: [facility];  Service: Cardiovascular;  Laterality: N/A;   LEFT HEART CATH AND CORONARY ANGIOGRAPHY N/A 05/07/2021   Procedure: LEFT HEART CATH AND CORONARY ANGIOGRAPHY;  Surgeon: Nigel Mormon, MD;  Location: [facility];  Service: Cardiovascular;  Laterality: N/A;   SUBMUCOSAL TATTOO INJECTION  11/14/2019   Procedure: SUBMUCOSAL TATTOO INJECTION;  Surgeon: Milus Banister, MD;  Location: [facility];  Service: Endoscopy;;  mini probe ultrasound   TONSILLECTOMY     Social History   Tobacco Use  Smoking Status Former   Packs/day: 1.00   Years: 3.00   Pack years: 3.00   Types: Cigarettes, Cigars   Quit date: 2000   Years since  quitting: 22.9  Smokeless Tobacco Never   Social History   Substance and Sexual Activity  Alcohol Use Yes   Comment: socially   Family History  Problem Relation Age of Onset   Diabetes Mother    Heart disease Father    Diabetes Father    Diabetes Sister    Breast cancer Sister    Heart attack Brother 77       first heart attack   Asthma Maternal Grandmother     Diabetes Other    Hypertension Other    CAD Other     Current Outpatient Medications on File Prior to Visit  Medication Sig Dispense Refill   aspirin EC 81 MG tablet Take 1 tablet (81 mg total) by mouth daily. Swallow whole. 90 tablet 3   cholecalciferol (VITAMIN D) 1000 UNITS tablet Take 1,000 Units by mouth every morning.      clopidogrel (PLAVIX) 75 MG tablet Take 1 tablet (75 mg total) by mouth daily. 30 tablet 1   fluticasone (FLONASE) 50 MCG/ACT nasal spray Place 1 spray into both nostrils 2 (two) times daily as needed for allergies or rhinitis. 16 g 5   losartan-hydrochlorothiazide (HYZAAR) 100-12.5 MG tablet Take 1 tablet by mouth daily.      Multiple Vitamins-Minerals (MULTIVITAMIN WITH MINERALS) tablet Take 1 tablet by mouth daily.     pantoprazole (PROTONIX) 20 MG tablet Take 1 tablet (20 mg total) by mouth daily. 30 tablet 1   albuterol (VENTOLIN HFA) 108 (90 Base) MCG/ACT inhaler Inhale 2 puffs into the lungs every 4 (four) hours as needed for wheezing or shortness of breath. (Patient not taking: Reported on 05/20/2021) 18 g 3   Fluticasone-Salmeterol,sensor, (AIRDUO DIGIHALER) 113-14 MCG/ACT AEPB Inhale 1 puff into the lungs daily. (Patient not taking: Reported on 05/06/2021) 1 each 3   metoprolol tartrate (LOPRESSOR) 25 MG tablet Take 25 mg by mouth 2 (two) times daily. (Patient not taking: Reported on 05/06/2021)     No current facility-administered medications on file prior to visit.    Cardiovascular and other pertinent studies: EKG 05/07/2021: Sinus rhythm LVH T wave inversion likely due to LVH   Echocardiogram 05/06/2021:  Normal LV systolic function with EF 61%. Left ventricle cavity is normal  in size. 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 Septum  measures 1.7 cm. Normal global wall motion. Doppler evidence of grade II  (pseudonormal) diastolic dysfunction, elevated LAP.  Left atrial cavity is mildly dilated.  Moderate (Grade II) mitral  regurgitation.  Mild to moderate tricuspid regurgitation. Estimated pulmonary artery  systolic pressure 26 mmHg.   Coronary intervention 05/06/2021: LM: Normal LAD: Mid LAD 75-80% stenoses. RFR 0.89 Lcx: No significant disease RCA: No significant disease   Successful percutaneous coronary intervention mid LAD (non-overlapping stents)        PTCA and stent placement 3.5 X 20 mm Synergy drug-eluting stent, post dilatation with 3.75 mm [place]        PTCA and stent placement 4.0 X 16 mm Synergy drug-eluting stent, post dilatation with 4.0 mm [place]   Post PCI RFR 0.93  EKG 05/06/2021: Sinus rhythm 57 bpm  LVH Old anteroseptal infarct Anterolateral and inferior T wave inversion, possible ischemia   Recent labs: CMP Latest Ref Rng & Units 05/07/2021 05/06/2021 05/08/2014  Glucose 70 - 99 mg/dL 107(H) 95 137(H)  BUN 8 - 23 mg/dL 14 13 25(H)  Creatinine 0.61 - 1.24 mg/dL 1.08 1.09 1.05  Sodium 135 - 145 mmol/L 140 140 140  Potassium 3.5 -  5.1 mmol/L 3.9 4.4 4.7  Chloride 98 - 111 mmol/L 108 102 98  CO2 22 - 32 mmol/L '29 26 25  ' Calcium 8.9 - 10.3 mg/dL 9.3 9.8 10.4   CBC Latest Ref Rng & Units 05/07/2021 05/06/2021 05/07/2014  WBC 4.0 - 10.5 K/uL 4.0 4.2 5.8  Hemoglobin 13.0 - 17.0 g/dL 14.3 14.9 15.9  Hematocrit 39.0 - 52.0 % 43.2 44.8 46.0  Platelets 150 - 400 K/uL 188 190 200   Lipid Panel  No results found for: CHOL, TRIG, HDL, CHOLHDL, VLDL, LDLCALC, LDLDIRECT HEMOGLOBIN A1C No results found for: HGBA1C, MPG TSH No results for input(s): TSH in the last 8760 hours.  01/12/2021: Glucose 98, BUN/Cr 16/1.1. EGFR 80. Na/K 141/4.2. Rest of the CMP normal H/H 12.8/37.7. MCV 89. Platelets 138 HbA1C N/A Chol 213, TG 80, HDL 49, LDL 148 TSH 0.9 normal   Review of Systems  Cardiovascular:  Negative for chest pain, dyspnea on exertion, leg swelling, palpitations and syncope.  Respiratory:  Negative for shortness of breath.         Vitals:   05/20/21 0929  BP: 129/79  Pulse: 73  Temp:  97.6 F (36.4 C)  SpO2: 99%     Body mass index is 27.53 kg/m. Filed Weights   05/20/21 0929  Weight: 203 lb (92.1 kg)     Objective:   Physical Exam Vitals reviewed.  Constitutional:      General: He is not in acute distress. Neck:     Vascular: No JVD.  Cardiovascular:     Rate and Rhythm: Normal rate and regular rhythm.     Heart sounds: Normal heart sounds. No murmur heard.    Comments: Radial access site well-healed without evidence of hematoma, bruit, ecchymosis. Pulmonary:     Effort: Pulmonary effort is normal.     Breath sounds: Normal breath sounds. No wheezing or rales.  Musculoskeletal:     Right lower leg: No edema.     Left lower leg: No edema.        Assessment & Recommendations:   70 y.o. AA male with hypertension, h/o bladder cancer, CAD, hyperlipidemia, family history of CAD.   CAD: S/p PCI to mid LAD 05/06/2021 Recommend uninterrupted dual antiplatelet therapy with aspirin and Plavix for minimum of 6 months Continue losartan/HCTZ As patient is refusing to take an additional number of medications we will stop amlodipine and switch him to metoprolol tartrate 25 mg p.o. twice daily as prescribed at discharge. Patient refuses to take statin therapy.  Discussed at length with patient the benefits of statin medication given his history of CAD as well as the risks of not taking this medication.  Patient verbalized understanding despite acknowledgment of the risks still refuses to take statin medication.  Therefore discussed nonstatin therapies, however patient is resistant to this as well.  Hypertension: Well-controlled Continue losartan/hydrochlorothiazide Stop amlodipine and start Lopressor 25 mg p.o. twice daily Continue to monitor blood pressure regularly at home.  Hyperlipidemia: Patient refuses statin therapy despite acknowledgment of its benefits as well as the risks of not taking it given his history of CAD.  Patient is also unwilling to  consider nonstatin therapies at this time. LDL goal <70  LVH:  Echocardiogram noted moderate asymmetric hypertrophy without any LVOT obstruction.   Given that his 28, his risk of sudden death from even hypertrophic cardiomyopathy low.   Will repeat echocardiogram in 3 months. Will consider MRI for further work-up if he has recurrent dyspnea symptoms.  Follow-up in  3 months, sooner if needed, for results of echo, CAD, hypertension, hyperlipidemia.   Alethia Berthold, PA-C 05/20/2021, 1:07 PM Office: [phone]

## 2021-05-20 ENCOUNTER — Other Ambulatory Visit: Payer: Self-pay

## 2021-05-20 ENCOUNTER — Ambulatory Visit: Payer: Medicare Other | Admitting: Student

## 2021-05-20 ENCOUNTER — Encounter: Payer: Self-pay | Admitting: Student

## 2021-05-20 VITALS — BP 129/79 | HR 73 | Temp 97.6°F | Ht 72.0 in | Wt 203.0 lb

## 2021-05-20 DIAGNOSIS — Z955 Presence of coronary angioplasty implant and graft: Secondary | ICD-10-CM

## 2021-05-20 DIAGNOSIS — I10 Essential (primary) hypertension: Secondary | ICD-10-CM

## 2021-05-20 DIAGNOSIS — I517 Cardiomegaly: Secondary | ICD-10-CM

## 2021-05-20 DIAGNOSIS — I25118 Atherosclerotic heart disease of native coronary artery with other forms of angina pectoris: Secondary | ICD-10-CM

## 2021-05-20 DIAGNOSIS — E7800 Pure hypercholesterolemia, unspecified: Secondary | ICD-10-CM

## 2021-05-24 ENCOUNTER — Telehealth (HOSPITAL_COMMUNITY): Payer: Self-pay

## 2021-05-24 ENCOUNTER — Other Ambulatory Visit (HOSPITAL_COMMUNITY): Payer: Self-pay

## 2021-05-24 NOTE — Telephone Encounter (Signed)
Pharmacy Transitions of Care Follow-up Telephone Call  Date of discharge: 05/07/21  Discharge Diagnosis: CAD, Stent placement  How have you been since you were released from the hospital? Mr. Yellowhair is feeling much better.    Medication changes made at discharge:  - START: Plavix, Aspirin   - STOPPED: Atorvastatin  - CHANGED: Losartan/hctz  Medication changes verified by the patient? Yes   Medication Accessibility:  Home Pharmacy: [facility]   Was the patient provided with refills on discharged medications? Yes   Have all prescriptions been transferred from [facility] to home pharmacy? Pt states he has all his meds.   Is the patient able to afford medications? yes Notable copays:  Eligible patient assistance:     Medication Review:  CLOPIDOGREL (PLAVIX) Clopidogrel 75 mg once daily.   - Reviewed potential DDIs with patient  - Advised patient of medications to avoid (NSAIDs, ASA)  - Educated that Tylenol (acetaminophen) will be the preferred analgesic to prevent risk of bleeding  - Emphasized importance of monitoring for signs and symptoms of bleeding (abnormal bruising, prolonged bleeding, nose bleeds, bleeding from gums, discolored urine, black tarry stools)  - Advised patient to alert all providers of anticoagulation therapy prior to starting a new medication or having a procedure    Follow-up Appointments:  PCP Hospital f/u appt confirmed? 05/20/21 cardiology   If their condition worsens, is the pt aware to call PCP or go to the Emergency Dept.? yes  Final Patient Assessment: Mr. Cass is feeling much better. He has all his medications and has [employer] benefits. He wakes up every morning with a mild headache which he asked his doctor about. It may be coming from his new medications. He received a call about rehab which he would like to participate in. I contacted his MD office since the person who contacted him is on FMLA.  Kenneth, PA-C said she would follow up.

## 2021-06-01 ENCOUNTER — Other Ambulatory Visit (HOSPITAL_COMMUNITY): Payer: Self-pay

## 2021-06-02 ENCOUNTER — Other Ambulatory Visit (HOSPITAL_COMMUNITY): Payer: Self-pay

## 2021-06-02 ENCOUNTER — Other Ambulatory Visit: Payer: Self-pay

## 2021-06-07 ENCOUNTER — Other Ambulatory Visit (HOSPITAL_COMMUNITY): Payer: Self-pay

## 2021-06-08 ENCOUNTER — Telehealth: Payer: Self-pay

## 2021-06-08 NOTE — Telephone Encounter (Signed)
Agree with following up with PCP. Patient is on aspirin and Plavix but would hold off on stopping these until he has been evaluated by PCP.

## 2021-06-22 ENCOUNTER — Other Ambulatory Visit: Payer: Self-pay

## 2021-06-22 ENCOUNTER — Ambulatory Visit (INDEPENDENT_AMBULATORY_CARE_PROVIDER_SITE_OTHER): Payer: Medicare Other | Admitting: Allergy and Immunology

## 2021-06-22 VITALS — BP 140/70 | HR 67 | Temp 97.2°F | Resp 16 | Ht 72.0 in | Wt 201.0 lb

## 2021-06-22 DIAGNOSIS — J069 Acute upper respiratory infection, unspecified: Secondary | ICD-10-CM

## 2021-06-22 DIAGNOSIS — K219 Gastro-esophageal reflux disease without esophagitis: Secondary | ICD-10-CM | POA: Diagnosis not present

## 2021-06-22 DIAGNOSIS — J4541 Moderate persistent asthma with (acute) exacerbation: Secondary | ICD-10-CM

## 2021-06-22 DIAGNOSIS — J3089 Other allergic rhinitis: Secondary | ICD-10-CM

## 2021-06-22 MED ORDER — FLUTICASONE PROPIONATE 50 MCG/ACT NA SUSP: 1.0000 | Freq: Two times a day (BID) | NASAL | 5 refills | Status: DC | PRN

## 2021-06-22 MED ORDER — METHYLPREDNISOLONE ACETATE 80 MG/ML IJ SUSP
80.0000 mg | Freq: Once | INTRAMUSCULAR | Status: AC
  Administered 2021-06-22: 80 mg via INTRAMUSCULAR

## 2021-06-22 MED ORDER — ALBUTEROL SULFATE HFA 108 (90 BASE) MCG/ACT IN AERS: 2.0000 | INHALATION_SPRAY | RESPIRATORY_TRACT | 1 refills | Status: DC | PRN

## 2021-06-22 MED ORDER — BREZTRI AEROSPHERE 160-9-4.8 MCG/ACT IN AERO: 2.0000 | INHALATION_SPRAY | Freq: Two times a day (BID) | RESPIRATORY_TRACT | 5 refills | Status: DC

## 2021-06-22 MED ORDER — PANTOPRAZOLE SODIUM 20 MG PO TBEC: 20.0000 mg | DELAYED_RELEASE_TABLET | Freq: Every day | ORAL | 5 refills | Status: DC

## 2021-06-22 MED ORDER — FEXOFENADINE HCL 180 MG PO TABS: 180.0000 mg | ORAL_TABLET | Freq: Two times a day (BID) | ORAL | 5 refills | Status: DC | PRN

## 2021-06-22 NOTE — Progress Notes (Signed)
[facility]   Follow-up Note  Referring Provider: [name], MD Primary Provider: [name], MD Date of Office Visit: 06/22/2021  Subjective:   Alexander Burns (DOB: April 14, 1951) is a 71 y.o. male who returns to [facility] on 06/22/2021 in re-evaluation of the following:  HPI: Alexander Burns returns to this clinic in evaluation of asthma, allergic rhinitis, and LPR.  His last visit to this clinic was 19 May 2020.  Alexander Burns was really doing very well regarding his airway over the course of the past year and he slowly tapered off most of his medications directed at inflammation of his airway and still continued to do good and did not require systemic steroid or antibiotic for any type of airway issue.  Alexander Burns developed angina of both exertional and nonexertional presentation and required cardiac catheterization and placement of 2 stents in early December 2022 which resulted in resolution of this issue.  About 10 days ago he developed acute onset of runny nose and clear rhinorrhea and coughing without any significant sputum production, ugly nasal discharge, anosmia, chest pain, or fever.  He did a home COVID test which was negative.  He is now using pantoprazole to treat his reflux.  He apparently develops very dark stools while using Plavix and aspirin and it was suggested to him that he consistently uses pantoprazole on a daily basis and since he is done so all that issue has resolved.  Allergies as of 06/22/2021       Reactions   Nabumetone Hives, Rash        Medication List    AirDuo Digihaler 113-14 MCG/ACT Aepb Generic drug: Fluticasone-Salmeterol(sensor) Inhale 1 puff into the lungs daily.   albuterol 108 (90 Base) MCG/ACT inhaler Commonly known as: VENTOLIN HFA Inhale 2 puffs into the lungs every 4 (four) hours as needed for wheezing or shortness of breath.   aspirin EC 81 MG tablet Take 1  tablet (81 mg total) by mouth daily. Swallow whole.   cholecalciferol 1000 units tablet Commonly known as: VITAMIN D Take 1,000 Units by mouth every morning.   clopidogrel 75 MG tablet Commonly known as: Plavix Take 1 tablet (75 mg total) by mouth daily.   fluticasone 50 MCG/ACT nasal spray Commonly known as: FLONASE Place 1 spray into both nostrils 2 (two) times daily as needed for allergies or rhinitis.   losartan-hydrochlorothiazide 100-12.5 MG tablet Commonly known as: HYZAAR Take 1 tablet by mouth daily.   metoprolol tartrate 25 MG tablet Commonly known as: LOPRESSOR Take 25 mg by mouth 2 (two) times daily.   multivitamin with minerals tablet Take 1 tablet by mouth daily.   pantoprazole 20 MG tablet Commonly known as: Protonix Take 1 tablet (20 mg total) by mouth daily.   Propylene Glycol 0.6 % Soln Place 1 drop into both eyes 4 (four) times daily.    Past Medical History:  Diagnosis Date   Asthma    Bladder cancer ([place]) 2018   Hypertension     Past Surgical History:  Procedure Laterality Date   BACK SURGERY     CORONARY STENT INTERVENTION N/A 05/07/2021   Procedure: CORONARY STENT INTERVENTION;  Surgeon: Nigel Mormon, MD;  Location: [facility];  Service: Cardiovascular;  Laterality: N/A;   EUS N/A 11/14/2019   Procedure: LOWER ENDOSCOPIC ULTRASOUND (EUS);  Surgeon: Milus Banister, MD;  Location: [facility];  Service: Endoscopy;  Laterality: N/A;   FLEXIBLE SIGMOIDOSCOPY  N/A 11/14/2019   Procedure: FLEXIBLE SIGMOIDOSCOPY;  Surgeon: Milus Banister, MD;  Location: [facility];  Service: Endoscopy;  Laterality: N/A;   FOOT SURGERY     INTRAVASCULAR PRESSURE WIRE/FFR STUDY N/A 05/07/2021   Procedure: INTRAVASCULAR PRESSURE WIRE/FFR STUDY;  Surgeon: Nigel Mormon, MD;  Location: [facility];  Service: Cardiovascular;  Laterality: N/A;   INTRAVASCULAR ULTRASOUND/IVUS N/A 05/07/2021   Procedure: Intravascular Ultrasound/IVUS;   Surgeon: Nigel Mormon, MD;  Location: [facility];  Service: Cardiovascular;  Laterality: N/A;   LEFT HEART CATH AND CORONARY ANGIOGRAPHY N/A 05/07/2021   Procedure: LEFT HEART CATH AND CORONARY ANGIOGRAPHY;  Surgeon: Nigel Mormon, MD;  Location: [facility];  Service: Cardiovascular;  Laterality: N/A;   SUBMUCOSAL TATTOO INJECTION  11/14/2019   Procedure: SUBMUCOSAL TATTOO INJECTION;  Surgeon: Milus Banister, MD;  Location: [facility];  Service: Endoscopy;;  mini probe ultrasound   TONSILLECTOMY      Review of systems negative except as noted in HPI / PMHx or noted below:  Review of Systems  Constitutional: Negative.   HENT: Negative.    Eyes: Negative.   Respiratory: Negative.    Cardiovascular: Negative.   Gastrointestinal: Negative.   Genitourinary: Negative.   Musculoskeletal: Negative.   Skin: Negative.   Neurological: Negative.   Endo/Heme/Allergies: Negative.   Psychiatric/Behavioral: Negative.      Objective:   Vitals:   06/22/21 1505  BP: 140/70  Pulse: 67  Resp: 16  Temp: (!) 97.2 F (36.2 C)  SpO2: 98%   Height: 6' (182.9 cm)  Weight: 201 lb (91.2 kg)   Physical Exam Constitutional:      Appearance: He is not diaphoretic.  HENT:     Head: Normocephalic.     Right Ear: Tympanic membrane, ear canal and external ear normal.     Left Ear: Tympanic membrane, ear canal and external ear normal.     Nose: Nose normal. No mucosal edema or rhinorrhea.     Mouth/Throat:     Pharynx: Uvula midline. No oropharyngeal exudate.  Eyes:     Conjunctiva/sclera: Conjunctivae normal.  Neck:     Thyroid: No thyromegaly.     Trachea: Trachea normal. No tracheal tenderness or tracheal deviation.  Cardiovascular:     Rate and Rhythm: Normal rate and regular rhythm.     Heart sounds: Normal heart sounds, S1 normal and S2 normal. No murmur heard. Pulmonary:     Effort: No respiratory distress.     Breath sounds: Normal breath sounds. No  stridor. No wheezing or rales.  Lymphadenopathy:     Head:     Right side of head: No tonsillar adenopathy.     Left side of head: No tonsillar adenopathy.     Cervical: No cervical adenopathy.  Skin:    Findings: No erythema or rash.     Nails: There is no clubbing.  Neurological:     Mental Status: He is alert.    Diagnostics:    Spirometry was performed and demonstrated an FEV1 of 1.90 at 66 % of predicted.  Assessment and Plan:   1. Asthma, not well controlled, moderate persistent, with acute exacerbation   2. Viral upper respiratory tract infection   3. Other allergic rhinitis   4. Laryngopharyngeal reflux (LPR)     1.  Continue to perform Allergen avoidance measures - dust mite, weed pollen  2.  Start the following to Treat and prevent inflammation:   A.  Breztri -  2 inhalations 2 times per day  B.  Fluticasone -1-2 sprays each nostril 1 time per day  C.  Depomedrol 80 mg IM delivered in clinic  3.  Continue to Treat and prevent reflux:   A.  Consolidate caffeine consumption    B.  Pantoprazole 40 mg tablet 1 time per day  4.  If needed:   A.  Nasal saline spray  B.  OTC antihistamine  C.  Ventolin HFA or similar 2 inhalations every 4-6 hours  5.  Return to clinic in 4 weeks or earlier if problem  Alexander Burns has contracted some type of viral respiratory tract infection that is giving rise to inflammation of his airway and he will utilize the plan noted above to address this issue.  He will also continue to treat his reflux with a proton pump inhibitor.  I would like to see him back in his clinic in 4 weeks to make a determination about a long-term plan regarding his respiratory tract issue once he gets over this acute event.  He will contact me during interval should there be a problem.  Allena Katz, MD Allergy / Immunology [place]

## 2021-06-22 NOTE — Patient Instructions (Addendum)
°  1.  Continue to perform Allergen avoidance measures - dust mite, weed pollen  2.  Start the following to Treat and prevent inflammation:   A.  Breztri - 2 inhalations 2 times per day  B.  Fluticasone -1-2 sprays each nostril 1 time per day  C.  Depomedrol 80 mg IM delivered in clinic  3.  Continue to Treat and prevent reflux:   A.  Consolidate caffeine consumption    B.  Pantoprazole 40 mg tablet 1 time per day  4.  If needed:   A.  Nasal saline spray  B.  OTC antihistamine  C.  Ventolin HFA or similar 2 inhalations every 4-6 hours  5.  Return to clinic in 4 weeks or earlier if problem

## 2021-06-23 ENCOUNTER — Encounter: Payer: Self-pay | Admitting: Allergy and Immunology

## 2021-06-24 ENCOUNTER — Other Ambulatory Visit: Payer: Self-pay

## 2021-06-24 ENCOUNTER — Telehealth: Payer: Self-pay

## 2021-06-24 MED ORDER — AMLODIPINE BESYLATE 10 MG PO TABS: 10.0000 mg | ORAL_TABLET | Freq: Every day | ORAL | 3 refills | Status: DC

## 2021-06-24 MED ORDER — HYDROCHLOROTHIAZIDE 25 MG PO TABS: 25.0000 mg | ORAL_TABLET | Freq: Every day | ORAL | 3 refills | Status: DC

## 2021-06-24 NOTE — Telephone Encounter (Signed)
Unlikely to be related to the stent. Small possibility that it could be related to losartan (although uncommon).  Recommend the following: Stop losartan-HCTZ 100-12.5. Start HCTZ 25 mg and amlodipine 10 mg daily. Please send 30 pillsX refill of each Make a f/u w/CC or me in next 1-2 weeks please.  Thanks MJP

## 2021-06-24 NOTE — Telephone Encounter (Signed)
Called pt to inform him about per Dr. Virgina Jock. Stop losartan-HCTZ 100-12.5. Start HCTZ 25 mg and amlodipine 10 mg daily. Please send 30 pillsX refill of each Make a f/u w/CC or me in next 1-2 weeks please.

## 2021-06-29 DIAGNOSIS — I10 Essential (primary) hypertension: Secondary | ICD-10-CM | POA: Insufficient documentation

## 2021-06-29 NOTE — Progress Notes (Addendum)
Patient referred by [name], MD for shortness of breath  Subjective:   Alexander Burns, male    DOB: [birth year], 71 y.o.   MRN: [id number]   Chief Complaint  Patient presents with   Shortness of Breath   Medication Problem    HPI  71 y/o [place] American male with hypertension, mixed hyperlipidemia, family h/o CAD, s/p mid LAD PCI (05/2021) for unstable angina  Patient has had complete resolution of chest pain since PCI.  However, he has developed intractable dry cough ever since he started taking Plavix.  Given that he was also on losartan, less likely cause for cough, stopped its losartan and started amlodipine for blood pressure control instead.  However, his cough is persisted.  He also saw his asthma and allergy specialist Dr. Neldon Mc, who also felt symptoms could be related to allergies, GERD and recommended certain medications. However, patient's cough has persisted and is affecting his quality of life. He says he has become "isolated" because of this.   On a separate note, he has stopped lipitor 80 mg previously prescribed, stating "it did not agree with me". He also feels that he does not need to take statin, as his blockages are now "taken care of".    Current Outpatient Medications on File Prior to Visit  Medication Sig Dispense Refill   albuterol (VENTOLIN HFA) 108 (90 Base) MCG/ACT inhaler Inhale 2 puffs into the lungs every 4 (four) hours as needed for wheezing or shortness of breath. 18 g 1   amLODipine (NORVASC) 10 MG tablet Take 1 tablet (10 mg total) by mouth daily. 30 tablet 3   aspirin EC 81 MG tablet Take 1 tablet (81 mg total) by mouth daily. Swallow whole. 90 tablet 3   Budeson-Glycopyrrol-Formoterol (BREZTRI AEROSPHERE) 160-9-4.8 MCG/ACT AERO Inhale 2 puffs into the lungs in the morning and at bedtime. 11 g 5   cholecalciferol (VITAMIN D) 1000 UNITS tablet Take 1,000 Units by mouth every morning.      clopidogrel (PLAVIX) 75 MG tablet Take 1 tablet (75  mg total) by mouth daily. 30 tablet 1   fexofenadine (ALLEGRA) 180 MG tablet Take 1 tablet (180 mg total) by mouth 2 (two) times daily as needed for allergies or rhinitis (Can use an extra dose during flare ups.). 60 tablet 5   fluticasone (FLONASE) 50 MCG/ACT nasal spray Place 1 spray into both nostrils 2 (two) times daily as needed for allergies or rhinitis. 16 g 5   metoprolol tartrate (LOPRESSOR) 25 MG tablet Take 25 mg by mouth 2 (two) times daily.     Multiple Vitamins-Minerals (MULTIVITAMIN WITH MINERALS) tablet Take 1 tablet by mouth daily.     pantoprazole (PROTONIX) 20 MG tablet Take 1 tablet (20 mg total) by mouth daily. 30 tablet 5   Propylene Glycol 0.6 % SOLN Place 1 drop into both eyes 4 (four) times daily.     No current facility-administered medications on file prior to visit.    Cardiovascular and other pertinent studies:  EKG 05/07/2021: Sinus rhythm LVH T wave inversion likely due to LVH   Echocardiogram 05/06/2021:  Normal LV systolic function with EF 61%. Left ventricle cavity is normal  in size. 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 Septum  measures 1.7 cm. Normal global wall motion. Doppler evidence of grade II  (pseudonormal) diastolic dysfunction, elevated LAP.  Left atrial cavity is mildly dilated.  Moderate (Grade II) mitral regurgitation.  Mild to moderate tricuspid regurgitation. Estimated pulmonary artery  systolic pressure 26 mmHg.   Coronary intervention 05/06/2021: LM: Normal LAD: Mid LAD 75-80% stenoses. RFR 0.89 Lcx: No significant disease RCA: No significant disease   Successful percutaneous coronary intervention mid LAD (non-overlapping stents)        PTCA and stent placement 3.5 X 20 mm Synergy drug-eluting stent, post dilatation with 3.75 mm [place]        PTCA and stent placement 4.0 X 16 mm Synergy drug-eluting stent, post dilatation with 4.0 mm [place]   Post PCI RFR 0.93  EKG 05/06/2021: Sinus rhythm 57 bpm  LVH Old  anteroseptal infarct Anterolateral and inferior T wave inversion, possible ischemia   Recent labs: 05/07/2021: Glucose 107, BUN/Cr 14/0.8. EGFR >60. Na/K 140/3.9. Rest of the CMP normal H/H 14/43. MCV 91. Platelets 188 BNP 24  01/12/2021: Glucose 98, BUN/Cr 16/1.1. EGFR 80. Na/K 141/4.2. Rest of the CMP normal H/H 12.8/37.7. MCV 89. Platelets 138 HbA1C N/A Chol 213, TG 80, HDL 49, LDL 148 TSH 0.9 normal   Review of Systems  Cardiovascular:  Negative for chest pain, dyspnea on exertion, leg swelling, palpitations and syncope.  Respiratory:  Positive for cough. Negative for shortness of breath.         Vitals:   06/30/21 1020  BP: 138/71  Pulse: (!) 53  Resp: 16  Temp: 97.6 F (36.4 C)  SpO2: 99%     Body mass index is 27.4 kg/m. Filed Weights   06/30/21 1020  Weight: 202 lb (91.6 kg)     Objective:   Physical Exam Vitals reviewed.  Constitutional:      General: He is not in acute distress. Neck:     Vascular: No JVD.  Cardiovascular:     Rate and Rhythm: Normal rate and regular rhythm.     Heart sounds: Normal heart sounds. No murmur heard. Pulmonary:     Effort: Pulmonary effort is normal.     Breath sounds: Normal breath sounds. No wheezing or rales.  Musculoskeletal:     Right lower leg: No edema.     Left lower leg: No edema.        Assessment & Recommendations:   71 y/o [place] male with hypertension, mixed hyperlipidemia, family h/o CAD, s/p mid LAD PCI (05/2021), now with intractable cough  Cough: Unclear etiology.  No improvement after stopping losartan.  Asthma, GERD likely contributing.  However, patient is convinced that his asthma and GERD were otherwise well controlled and cough only started after his recent PCI and starting on Plavix and metoprolol.  I am okay with a trial of stopping Plavix and metoprolol to see if his symptoms improve.  CAD: S/p PCI to mid LAD 05/06/2021 No bleeding issues, but intractable cough that patient  attributes to Plavix.  He is now 7 weeks out of PCI for unstable angina without elevation of cardiac biomarkers.  Stents are at least 3.5 mm in diameter.  I am okay with him stopping Plavix, with relatively low risk of stent thrombosis. Continue aspirin 81 mg daily. Okay to stop metoprolol given complete revascularization. Emphasized importance of lipid-lowering therapy.  In addition to diet and lifestyle modifications, absolutely recommend high intensity statin.  He does not want to try Lipitor again because of side effects, although is unclear about the specific details.  He is willing to start Crestor 10 mg daily.  Goal LDL <70. Okay to start cardiac rehab, he is very motivated to do so.   Hypertension: Well-controlled  Hyperlipidemia: As above.  LVH:  Echocardiogram noted moderate asymmetric hypertrophy without any LVOT obstruction.   Given that his 45, his risk of sudden death from even hypertrophic cardiomyopathy low.   Will repeat echocardiogram after next visit (will order)  F/u in 4 weeks   [employer], PA-C 06/29/2021, 1:49 PM Office: [phone]

## 2021-06-30 ENCOUNTER — Other Ambulatory Visit: Payer: Self-pay

## 2021-06-30 ENCOUNTER — Encounter: Payer: Self-pay | Admitting: Cardiology

## 2021-06-30 ENCOUNTER — Ambulatory Visit: Payer: Medicare Other | Admitting: Cardiology

## 2021-06-30 VITALS — BP 138/71 | HR 53 | Temp 97.6°F | Resp 16 | Ht 72.0 in | Wt 202.0 lb

## 2021-06-30 DIAGNOSIS — I2510 Atherosclerotic heart disease of native coronary artery without angina pectoris: Secondary | ICD-10-CM

## 2021-06-30 DIAGNOSIS — I10 Essential (primary) hypertension: Secondary | ICD-10-CM

## 2021-06-30 DIAGNOSIS — E782 Mixed hyperlipidemia: Secondary | ICD-10-CM

## 2021-06-30 MED ORDER — ROSUVASTATIN CALCIUM 20 MG PO TABS: 20.0000 mg | ORAL_TABLET | Freq: Every day | ORAL | 3 refills | Status: DC

## 2021-07-07 ENCOUNTER — Telehealth (HOSPITAL_COMMUNITY): Payer: Self-pay

## 2021-07-07 NOTE — Telephone Encounter (Signed)
Pt called stating he received a text message to call and get scheduled for cardiac rehab. I advised pt to disregard any text messages he receives to contact us for scheduling because those messages are coming from a 3rd party through [facility]. I advised pt that we do have his referral for scheduling and that he has been cleared to participate and that he has about 20 patients in front of him before we can schedule him. Pt understood.

## 2021-07-20 NOTE — Telephone Encounter (Signed)
Called patient to see if he was interested in participating in the Cardiac Rehab Program. Patient stated yes. Patient will come in for orientation on 08/19/21 @ 10AM and will attend the 10:30AM exercise class. Went over insurance, patient verbalized understanding.    Tourist information centre manager.

## 2021-07-20 NOTE — Telephone Encounter (Signed)
Pt insurance is active and benefits verified through [facility] Medicare. Co-pay $0.00, DED $0.00/$0.00 met, out of pocket $4,500.00/$82.32 met, co-insurance 0%. No pre-authorization required. Passport, 07/20/21 @ 1:59PM, DUY#[id number]   Will contact patient to see if he is interested in the Cardiac Rehab Program.

## 2021-07-27 ENCOUNTER — Ambulatory Visit: Payer: Non-veteran care | Admitting: Allergy and Immunology

## 2021-08-18 ENCOUNTER — Telehealth (HOSPITAL_COMMUNITY): Payer: Self-pay

## 2021-08-19 ENCOUNTER — Ambulatory Visit: Payer: Medicare Other

## 2021-08-19 ENCOUNTER — Encounter (HOSPITAL_COMMUNITY): Payer: Self-pay

## 2021-08-19 ENCOUNTER — Other Ambulatory Visit: Payer: Self-pay

## 2021-08-19 ENCOUNTER — Encounter (HOSPITAL_COMMUNITY)
Admission: RE | Admit: 2021-08-19 | Discharge: 2021-08-19 | Disposition: A | Discharge status: Home / Self Care | Payer: Medicare Other | Source: Ambulatory Visit | Attending: Cardiology | Admitting: Cardiology

## 2021-08-19 VITALS — BP 124/70 | Ht 70.5 in | Wt 203.7 lb

## 2021-08-19 DIAGNOSIS — I517 Cardiomegaly: Secondary | ICD-10-CM

## 2021-08-19 DIAGNOSIS — Z955 Presence of coronary angioplasty implant and graft: Secondary | ICD-10-CM | POA: Insufficient documentation

## 2021-08-19 HISTORY — DX: Atherosclerotic heart disease of native coronary artery without angina pectoris: I25.10

## 2021-08-19 NOTE — Progress Notes (Addendum)
Cardiac Individual Treatment Plan ? ?Patient Details  ?Name: Alexander Burns ?MRN: [id number] ?Date of Birth: August 04, 1950 ?Referring Provider:   ?Flowsheet Row CARDIAC REHAB PHASE II ORIENTATION from 08/19/2021 in [place]  ?Referring Provider Vernell Leep, MD  ? ?  ? ? ?Initial Encounter Date:  ?Flowsheet Row CARDIAC REHAB PHASE II ORIENTATION from 08/19/2021 in [place]  ?Date 08/19/21  ? ?  ? ? ?Visit Diagnosis: 05/07/21 S/P DES x 2 LAD ? ?Patient's Home Medications on Admission: ? ?Current Outpatient Medications:  ?  amLODipine (NORVASC) 5 MG tablet, Take 5 mg by mouth in the morning., Disp: , Rfl:  ?  aspirin EC 81 MG tablet, Take 1 tablet (81 mg total) by mouth daily. Swallow whole., Disp: 90 tablet, Rfl: 3 ?  cholecalciferol (VITAMIN D) 1000 UNITS tablet, Take 1,000 Units by mouth every morning. , Disp: , Rfl:  ?  fexofenadine (ALLEGRA) 180 MG tablet, Take 1 tablet (180 mg total) by mouth 2 (two) times daily as needed for allergies or rhinitis (Can use an extra dose during flare ups.)., Disp: 60 tablet, Rfl: 5 ?  fluticasone (FLONASE) 50 MCG/ACT nasal spray, Place 1 spray into both nostrils 2 (two) times daily as needed for allergies or rhinitis., Disp: 16 g, Rfl: 5 ?  pantoprazole (PROTONIX) 40 MG tablet, Take 40 mg by mouth daily., Disp: , Rfl:  ?  albuterol (VENTOLIN HFA) 108 (90 Base) MCG/ACT inhaler, Inhale 2 puffs into the lungs every 4 (four) hours as needed for wheezing or shortness of breath. (Patient not taking: Reported on 08/19/2021), Disp: 18 g, Rfl: 1 ?  amLODipine (NORVASC) 10 MG tablet, Take 1 tablet (10 mg total) by mouth daily. (Patient not taking: Reported on 08/17/2021), Disp: 30 tablet, Rfl: 3 ?  Budeson-Glycopyrrol-Formoterol (BREZTRI AEROSPHERE) 160-9-4.8 MCG/ACT AERO, Inhale 2 puffs into the lungs in the morning and at bedtime. (Patient not taking: Reported on 08/19/2021), Disp: 11 g, Rfl: 5 ?  pantoprazole (PROTONIX) 20  MG tablet, Take 1 tablet (20 mg total) by mouth daily. (Patient not taking: Reported on 08/17/2021), Disp: 30 tablet, Rfl: 5 ?  Propylene Glycol 0.6 % SOLN, Place 1 drop into both eyes 4 (four) times daily as needed (dry/irritated eyes). (Patient not taking: Reported on 08/19/2021), Disp: , Rfl:  ?  rosuvastatin (CRESTOR) 20 MG tablet, Take 1 tablet (20 mg total) by mouth daily. (Patient not taking: Reported on 08/17/2021), Disp: 30 tablet, Rfl: 3 ? ?Past Medical History: ?Past Medical History:  ?Diagnosis Date  ? Asthma   ? Bladder cancer ([place]) 2018  ? Coronary artery disease   ? Hypertension   ? ? ?Tobacco Use: ?Social History  ? ?Tobacco Use  ?Smoking Status Former  ? Packs/day: 1.00  ? Years: 3.00  ? Pack years: 3.00  ? Types: Cigarettes, Cigars  ? Quit date: 2000  ? Years since quitting: 23.2  ?Smokeless Tobacco Never  ? ? ?Labs: ?Recent Review Flowsheet Data   ? ? Labs for ITP Cardiac and Pulmonary Rehab 07/30/2007  ? HCO3 26.9(H)  ? TCO2 28  ? ?  ? ? ?Capillary Blood Glucose: ?No results found for: GLUCAP ? ? ?Exercise Target Goals: ?Exercise Program Goal: ?Individual exercise prescription set using results from initial 6 min walk test and THRR while considering  patient?s activity barriers and safety.  ? ?Exercise Prescription Goal: ?Starting with aerobic activity 30 plus minutes a day, 3 days per week for initial exercise prescription. Provide  home exercise prescription and guidelines that participant acknowledges understanding prior to discharge. ? ?Activity Barriers & Risk Stratification: ? Activity Barriers & Cardiac Risk Stratification - 08/19/21 1453   ? ?  ? Activity Barriers & Cardiac Risk Stratification  ? Activity Barriers Back Problems   ? Cardiac Risk Stratification Moderate   ? ?  ?  ? ?  ? ? ?6 Minute Walk: ? 6 Minute Walk   ? ? [place] Name 08/19/21 1452  ?  ?  ?  ? 6 Minute Walk  ? Phase Initial    ? Distance 1358 feet    ? Walk Time 6 minutes    ? # of Rest Breaks 0    ? MPH 2.57    ? METS 2.88    ?  RPE 9    ? Perceived Dyspnea  0    ? VO2 Peak 10.1    ? Symptoms No    ? Resting HR 56 bpm    ? Resting BP 124/70    ? Resting Oxygen Saturation  98 %    ? Exercise Oxygen Saturation  during 6 min walk 98 %    ? Max Ex. HR 88 bpm    ? Max Ex. BP 142/76    ? 2 Minute Post BP 132/70    ? ?  ?  ? ?  ? ? ?Oxygen Initial Assessment: ? ? ?Oxygen Re-Evaluation: ? ? ?Oxygen Discharge (Final Oxygen Re-Evaluation): ? ? ?Initial Exercise Prescription: ? Initial Exercise Prescription - 08/19/21 1400   ? ?  ? Date of Initial Exercise RX and Referring Provider  ? Date 08/19/21   ? Referring Provider Vernell Leep, MD   ? Expected Discharge Date 10/15/21   ?  ? Treadmill  ? MPH 2.5   ? Grade 0   ? Minutes 15   ? METs 2.91   ?  ? Bike  ? Level 1   ? Minutes 15   ? METs 2.5   ?  ? Prescription Details  ? Frequency (times per week) 3   ? Duration Progress to 30 minutes of continuous aerobic without signs/symptoms of physical distress   ?  ? Intensity  ? THRR 40-80% of Max Heartrate 60-119   ? Ratings of Perceived Exertion 11-13   ? Perceived Dyspnea 0-4   ?  ? Progression  ? Progression Continue progressive overload as per policy without signs/symptoms or physical distress.   ?  ? Resistance Training  ? Training Prescription Yes   ? Weight 5 lbs   ? Reps 10-15   ? ?  ?  ? ?  ? ? ?Perform Capillary Blood Glucose checks as needed. ? ?Exercise Prescription Changes: ? ? ?Exercise Comments: ? ? ?Exercise Goals and Review: ? ? Exercise Goals   ? ? [place] Name 08/19/21 1454  ?  ?  ?  ?  ?  ? Exercise Goals  ? Increase Physical Activity Yes      ? Intervention Provide advice, education, support and counseling about physical activity/exercise needs.;Develop an individualized exercise prescription for aerobic and resistive training based on initial evaluation findings, risk stratification, comorbidities and participant's personal goals.      ? Expected Outcomes Short Term: Attend rehab on a regular basis to increase amount of physical  activity.;Long Term: Add in home exercise to make exercise part of routine and to increase amount of physical activity.;Long Term: Exercising regularly at least 3-5 days a week.      ?  Increase Strength and Stamina Yes      ? Intervention Provide advice, education, support and counseling about physical activity/exercise needs.;Develop an individualized exercise prescription for aerobic and resistive training based on initial evaluation findings, risk stratification, comorbidities and participant's personal goals.      ? Expected Outcomes Short Term: Increase workloads from initial exercise prescription for resistance, speed, and METs.;Short Term: Perform resistance training exercises routinely during rehab and add in resistance training at home;Long Term: Improve cardiorespiratory fitness, muscular endurance and strength as measured by increased METs and functional capacity (6MWT)      ? Able to understand and use rate of perceived exertion (RPE) scale Yes      ? Intervention Provide education and explanation on how to use RPE scale      ? Expected Outcomes Short Term: Able to use RPE daily in rehab to express subjective intensity level;Long Term:  Able to use RPE to guide intensity level when exercising independently      ? Knowledge and understanding of Target Heart Rate Range (THRR) Yes      ? Expected Outcomes Short Term: Able to state/look up THRR;Short Term: Able to use daily as guideline for intensity in rehab;Long Term: Able to use THRR to govern intensity when exercising independently      ? Understanding of Exercise Prescription Yes      ? Intervention Provide education, explanation, and written materials on patient's individual exercise prescription      ? Expected Outcomes Short Term: Able to explain program exercise prescription;Long Term: Able to explain home exercise prescription to exercise independently      ? ?  ?  ? ?  ? ? ?Exercise Goals Re-Evaluation : ? ? ? ?Discharge Exercise Prescription  (Final Exercise Prescription Changes): ? ? ?Nutrition:  ?Target Goals: Understanding of nutrition guidelines, daily intake of sodium '1500mg'$ , cholesterol '200mg'$ , calories 30% from fat and 7% or less from satur

## 2021-08-19 NOTE — Progress Notes (Addendum)
Cardiac Rehab Medication Review by a Nurse ? ?Does the patient  feel that his/her medications are working for him/her?  yes ? ?Has the patient been experiencing any side effects to the medications prescribed?  no ? ?Does the patient measure his/her own blood pressure or blood glucose at home?  yes  ? ?Does the patient have any problems obtaining medications due to transportation or finances?   no ? ?Understanding of regimen: good ?Understanding of indications: fair ?Potential of compliance: poor ? ? ? ?Nurse comments: Mr Kloth is taking his medications as prescribed and says he has a good understanding of what his medications are for. Mr Macken says he checks his blood pressures at home.Mr [place] says he is not taking the crestor because he does not need to take it. Will notify Dr Virgina Jock. ? ? ? ?Harrell Gave RN ?08/19/2021 1:37 PM ?  ?

## 2021-08-23 ENCOUNTER — Encounter (HOSPITAL_COMMUNITY)
Admission: RE | Admit: 2021-08-23 | Discharge: 2021-08-23 | Disposition: A | Discharge status: Home / Self Care | Payer: Medicare Other | Source: Ambulatory Visit | Attending: Cardiology | Admitting: Cardiology

## 2021-08-23 ENCOUNTER — Other Ambulatory Visit: Payer: Self-pay

## 2021-08-23 DIAGNOSIS — Z955 Presence of coronary angioplasty implant and graft: Secondary | ICD-10-CM | POA: Diagnosis not present

## 2021-08-23 NOTE — Progress Notes (Signed)
Daily Session Note ? ?Patient Details  ?Name: Alexander Burns ?MRN: [id number] ?Date of Birth: [birth year] ?Referring Provider:   ?Flowsheet Row CARDIAC REHAB PHASE II ORIENTATION from 08/19/2021 in [place]  ?Referring Provider Vernell Leep, MD  ? ?  ? ? ?Encounter Date: 08/23/2021 ? ?Check In: ? Session Check In - 08/23/21 1033   ? ?  ? Check-In  ? Supervising physician immediately available to respond to emergencies [facility] immediately available   ? Physician(s) Dr Renne Crigler   ? Location [facility]   ? Staff Present Esmeralda Links BS, ACSM EP-C, Exercise Physiologist;Kanton Kamel, RN, Deland Pretty, MS, ACSM CEP, Exercise Physiologist;David Makemson, MS, ACSM-CEP, CCRP, Exercise Physiologist   ? Virtual Visit No   ? Medication changes reported     No   ? Fall or balance concerns reported    No   ? Tobacco Cessation No Change   ? Warm-up and Cool-down Performed as group-led instruction   ? Resistance Training Performed Yes   ? VAD Patient? No   ? PAD/SET Patient? No   ?  ? Pain Assessment  ? Currently in Pain? No/denies   ? Pain Score 0-No pain   ? Multiple Pain Sites No   ? ?  ?  ? ?  ? ? ?Capillary Blood Glucose: ?No results found for this or any previous visit (from the past 24 hour(s)). ? ? Exercise Prescription Changes - 08/23/21 1019   ? ?  ? Response to Exercise  ? Blood Pressure (Admit) 142/78   ? Blood Pressure (Exercise) 164/72   ? Blood Pressure (Exit) 128/72   ? Heart Rate (Admit) 65 bpm   ? Heart Rate (Exercise) 103 bpm   ? Heart Rate (Exit) 64 bpm   ? Rating of Perceived Exertion (Exercise) 7   ? Symptoms None   ? Comments Off to a great start with exercise.   ? Duration Continue with 30 min of aerobic exercise without signs/symptoms of physical distress.   ? Intensity THRR unchanged   ?  ? Progression  ? Progression Continue to progress workloads to maintain intensity without signs/symptoms of physical distress.   ? Average METs 3   ?   ? Resistance Training  ? Training Prescription Yes   ? Weight 5 lbs   ? Reps 10-15   ? Time 10 Minutes   ?  ? Interval Training  ? Interval Training No   ?  ? Treadmill  ? MPH 2.5   ? Grade 0   ? Minutes 15   ? METs 2.91   ?  ? Bike  ? Level 1.1   ? Minutes 15   ? METs 3.18   ? ?  ?  ? ?  ? ? ?Social History  ? ?Tobacco Use  ?Smoking Status Former  ? Packs/day: 1.00  ? Years: 3.00  ? Pack years: 3.00  ? Types: Cigarettes, Cigars  ? Quit date: 2000  ? Years since quitting: 23.2  ?Smokeless Tobacco Never  ? ? ?Goals Met:  ?Exercise tolerated well ?No report of concerns or symptoms today ?Strength training completed today ? ?Goals Unmet:  ?Not Applicable ? ?Comments: [name] started cardiac rehab today.  Pt tolerated light exercise without difficulty. Moderate exertional BP elevations noted will continue to monitor BP, telemetry-Sinus Rhythm T wave inversion, asymptomatic.  Medication list reconciled. Pt denies barriers to medicaiton compliance.  PSYCHOSOCIAL ASSESSMENT:  PHQ-0. Pt exhibits positive coping skills, hopeful outlook  with supportive family. No psychosocial needs identified at this time, no psychosocial interventions necessary.    Pt enjoys yard work, and working on cars.   Pt oriented to exercise equipment and routine.    Understanding verbalized.Harrell Gave RN BSN  ? ? ?Dr. Fransico Him is Medical Director for Cardiac Rehab at [facility]. ?

## 2021-08-25 ENCOUNTER — Other Ambulatory Visit: Payer: Self-pay

## 2021-08-25 ENCOUNTER — Encounter (HOSPITAL_COMMUNITY)
Admission: RE | Admit: 2021-08-25 | Discharge: 2021-08-25 | Disposition: A | Discharge status: Home / Self Care | Payer: Medicare Other | Source: Ambulatory Visit | Attending: Cardiology | Admitting: Cardiology

## 2021-08-25 DIAGNOSIS — Z955 Presence of coronary angioplasty implant and graft: Secondary | ICD-10-CM | POA: Diagnosis not present

## 2021-08-26 ENCOUNTER — Encounter: Payer: Self-pay | Admitting: Cardiology

## 2021-08-26 ENCOUNTER — Ambulatory Visit: Payer: Medicare Other | Admitting: Student

## 2021-08-26 ENCOUNTER — Ambulatory Visit: Payer: Medicare Other | Admitting: Cardiology

## 2021-08-26 ENCOUNTER — Encounter: Payer: Self-pay | Admitting: Student

## 2021-08-26 VITALS — BP 152/74 | HR 60 | Resp 16 | Ht 70.0 in | Wt 206.0 lb

## 2021-08-26 VITALS — BP 155/84 | HR 62 | Temp 96.2°F | Resp 16 | Ht 70.5 in | Wt 206.0 lb

## 2021-08-26 DIAGNOSIS — I2510 Atherosclerotic heart disease of native coronary artery without angina pectoris: Secondary | ICD-10-CM

## 2021-08-26 DIAGNOSIS — I10 Essential (primary) hypertension: Secondary | ICD-10-CM

## 2021-08-26 DIAGNOSIS — E782 Mixed hyperlipidemia: Secondary | ICD-10-CM

## 2021-08-26 NOTE — Progress Notes (Signed)
? ? ?Patient referred by Alysia Penna, MD for shortness of breath ? ?Subjective:  ? ?Alexander Burns, male    DOB: [birth year], 71 y.o.   MRN: [id number] ? ? ?Chief Complaint  ?Patient presents with  ? Coronary Artery Disease  ? Follow-up  ? ? ?HPI ? ?71 y/o [place] American male with hypertension, mixed hyperlipidemia, family h/o CAD, s/p mid LAD PCI (05/2021) for unstable angina ? ?Patient's cough symptoms have completely resolved after stopping Plavix and metoprolol.  He is doing well.  He is participating in cardiac rehab and exercising without any complaints of chest pain or shortness of breath.  He is not taking Crestor at this time. ? ? ? ?Current Outpatient Medications:  ?  albuterol (VENTOLIN HFA) 108 (90 Base) MCG/ACT inhaler, Inhale 2 puffs into the lungs every 4 (four) hours as needed for wheezing or shortness of breath., Disp: 18 g, Rfl: 1 ?  amLODipine (NORVASC) 5 MG tablet, Take 5 mg by mouth in the morning., Disp: , Rfl:  ?  aspirin EC 81 MG tablet, Take 1 tablet (81 mg total) by mouth daily. Swallow whole., Disp: 90 tablet, Rfl: 3 ?  Budeson-Glycopyrrol-Formoterol (BREZTRI AEROSPHERE) 160-9-4.8 MCG/ACT AERO, Inhale 2 puffs into the lungs in the morning and at bedtime., Disp: 11 g, Rfl: 5 ?  cholecalciferol (VITAMIN D) 1000 UNITS tablet, Take 1,000 Units by mouth every morning. , Disp: , Rfl:  ?  fluticasone (FLONASE) 50 MCG/ACT nasal spray, Place 1 spray into both nostrils 2 (two) times daily as needed for allergies or rhinitis., Disp: 16 g, Rfl: 5 ?  pantoprazole (PROTONIX) 40 MG tablet, Take 40 mg by mouth daily., Disp: , Rfl:  ?  Propylene Glycol 0.6 % SOLN, Place 1 drop into both eyes 4 (four) times daily as needed (dry/irritated eyes)., Disp: , Rfl:  ? ? ? ?Cardiovascular and other pertinent studies: ? ?EKG 05/07/2021: ?Sinus rhythm ?LVH ?T wave inversion likely due to LVH ?  ?Echocardiogram 05/06/2021:  ?Normal LV systolic function with EF 61%. Left ventricle cavity is normal  ?in size.  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 Septum  ?measures 1.7 cm. Normal global wall motion. Doppler evidence of grade II  ?(pseudonormal) diastolic dysfunction, elevated LAP.  ?Left atrial cavity is mildly dilated.  ?Moderate (Grade II) mitral regurgitation.  ?Mild to moderate tricuspid regurgitation. Estimated pulmonary artery  ?systolic pressure 26 mmHg. ?  ?Coronary intervention 05/06/2021: ?LM: Normal ?LAD: Mid LAD 75-80% stenoses. RFR 0.89 ?Lcx: No significant disease ?RCA: No significant disease ?  ?Successful percutaneous coronary intervention mid LAD (non-overlapping stents) ?       PTCA and stent placement 3.5 X 20 mm Synergy drug-eluting stent, post dilatation with 3.75 mm Pettit ?       PTCA and stent placement 4.0 X 16 mm Synergy drug-eluting stent, post dilatation with 4.0 mm Loco ?  ?Post PCI RFR 0.93 ? ?EKG 05/06/2021: ?Sinus rhythm 57 bpm  ?LVH ?Old anteroseptal infarct ?Anterolateral and inferior T wave inversion, possible ischemia ? ? ?Recent labs: ?05/07/2021: ?Glucose 107, BUN/Cr 14/0.8. EGFR >60. Na/K 140/3.9. Rest of the CMP normal ?H/H 14/43. MCV 91. Platelets 188 ?BNP 24 ? ?01/12/2021: ?Glucose 98, BUN/Cr 16/1.1. EGFR 80. Na/K 141/4.2. Rest of the CMP normal ?H/H 12.8/37.7. MCV 89. Platelets 138 ?HbA1C N/A ?Chol 213, TG 80, HDL 49, LDL 148 ?TSH 0.9 normal ? ? ?Review of Systems  ?Cardiovascular:  Negative for chest pain, dyspnea on exertion, leg swelling, palpitations and syncope.  ?Respiratory:  Positive for cough. Negative for shortness of breath.   ? ?   ? ? ?Vitals:  ? 08/26/21 1032  ?BP: (!) 152/74  ?Pulse: 60  ?Resp: 16  ?SpO2: 97%  ? ? ? ? ?Body mass index is 29.56 kg/m?. ?Filed Weights  ? 08/26/21 1032  ?Weight: 206 lb (93.4 kg)  ? ? ? ?Objective:  ? Physical Exam ?Vitals reviewed.  ?Constitutional:   ?   General: He is not in acute distress. ?Neck:  ?   Vascular: No JVD.  ?Cardiovascular:  ?   Rate and Rhythm: Normal rate and regular rhythm.  ?   Heart sounds: Normal heart  sounds. No murmur heard. ?Pulmonary:  ?   Effort: Pulmonary effort is normal.  ?   Breath sounds: Normal breath sounds. No wheezing or rales.  ?Musculoskeletal:  ?   Right lower leg: No edema.  ?   Left lower leg: No edema.  ? ?  ICD-10-CM   ?1. Coronary artery disease involving native coronary artery of native heart without angina pectoris  I25.10   ?  ?2. Essential hypertension  I10   ?  ?3. Mixed hyperlipidemia  E78.2   ?  ? ? ? ?   ? ?Assessment & Recommendations:  ? ?71 y/o [place] American male with hypertension, mixed hyperlipidemia, family h/o CAD, s/p mid LAD PCI (05/2021) for unstable angina ? ?CAD: ?S/p PCI to mid LAD 05/06/2021 ?Continue aspirin 81 mg daily.  Plavix stopped early due to intractable cough, that has since resolved. ?See below regarding hypertension and hyperlipidemia regimen. ?Continue cardiac rehab. ? ?Hypertension: ?Uncontrolled.  Increase amlodipine to 10 mg daily.  He may choose to take 5 mg twice daily instead. ? ?Hyperlipidemia: ?Currently not taking statin.  LDL is down to 110.  He is open to taking Crestor 1-2 times a week.  Okay with me. ? ?LVH:  ?Echocardiogram noted moderate asymmetric hypertrophy without any LVOT obstruction.   ?I will obtain echocardiogram after next visit, will order. ? ?F/u in 3 months ? ?[name], PA-C ?08/26/2021, 10:31 AM ?Office: [phone] ?

## 2021-08-26 NOTE — Progress Notes (Signed)
Error

## 2021-08-27 ENCOUNTER — Encounter (HOSPITAL_COMMUNITY)
Admission: RE | Admit: 2021-08-27 | Discharge: 2021-08-27 | Disposition: A | Discharge status: Home / Self Care | Payer: Medicare Other | Source: Ambulatory Visit | Attending: Cardiology | Admitting: Cardiology

## 2021-08-27 ENCOUNTER — Other Ambulatory Visit: Payer: Self-pay

## 2021-08-27 DIAGNOSIS — Z955 Presence of coronary angioplasty implant and graft: Secondary | ICD-10-CM

## 2021-08-30 ENCOUNTER — Other Ambulatory Visit: Payer: Self-pay

## 2021-08-30 ENCOUNTER — Encounter (HOSPITAL_COMMUNITY)
Admission: RE | Admit: 2021-08-30 | Discharge: 2021-08-30 | Disposition: A | Discharge status: Home / Self Care | Payer: Medicare Other | Source: Ambulatory Visit | Attending: Cardiology | Admitting: Cardiology

## 2021-08-30 DIAGNOSIS — Z955 Presence of coronary angioplasty implant and graft: Secondary | ICD-10-CM | POA: Diagnosis not present

## 2021-09-01 ENCOUNTER — Encounter (HOSPITAL_COMMUNITY)
Admission: RE | Admit: 2021-09-01 | Discharge: 2021-09-01 | Disposition: A | Discharge status: Home / Self Care | Payer: Medicare Other | Source: Ambulatory Visit | Attending: Cardiology | Admitting: Cardiology

## 2021-09-01 ENCOUNTER — Other Ambulatory Visit: Payer: Self-pay

## 2021-09-01 DIAGNOSIS — Z955 Presence of coronary angioplasty implant and graft: Secondary | ICD-10-CM | POA: Diagnosis not present

## 2021-09-03 ENCOUNTER — Encounter (HOSPITAL_COMMUNITY)
Admission: RE | Admit: 2021-09-03 | Discharge: 2021-09-03 | Disposition: A | Discharge status: Home / Self Care | Payer: Medicare Other | Source: Ambulatory Visit | Attending: Cardiology | Admitting: Cardiology

## 2021-09-03 DIAGNOSIS — Z955 Presence of coronary angioplasty implant and graft: Secondary | ICD-10-CM | POA: Diagnosis not present

## 2021-09-06 ENCOUNTER — Encounter (HOSPITAL_COMMUNITY)
Admission: RE | Admit: 2021-09-06 | Discharge: 2021-09-06 | Disposition: A | Discharge status: Home / Self Care | Payer: Medicare Other | Source: Ambulatory Visit | Attending: Cardiology | Admitting: Cardiology

## 2021-09-06 DIAGNOSIS — Z955 Presence of coronary angioplasty implant and graft: Secondary | ICD-10-CM | POA: Insufficient documentation

## 2021-09-06 DIAGNOSIS — Z48812 Encounter for surgical aftercare following surgery on the circulatory system: Secondary | ICD-10-CM | POA: Diagnosis present

## 2021-09-06 NOTE — Progress Notes (Addendum)
Reviewed home exercise guidelines with patient including endpoints, temperature precautions, target heart rate and rate of perceived exertion. Patient plans to ride his outdoor bike as his mode of home exercise. Discussed starting with 10-15 minutes and working up to 30 minutes of riding as tolerated since he hasn't ridden his bike in a few years, and patient is ameanble to this. Patient's goal is to know his parameters for exercise and to return to exercise in the [facility] program. Patient voices understanding of instructions given. ? ?Alexander Passer, MS, ACSM CEP ? ? ?

## 2021-09-08 ENCOUNTER — Encounter (HOSPITAL_COMMUNITY)
Admission: RE | Admit: 2021-09-08 | Discharge: 2021-09-08 | Disposition: A | Discharge status: Home / Self Care | Payer: Medicare Other | Source: Ambulatory Visit | Attending: Cardiology | Admitting: Cardiology

## 2021-09-08 DIAGNOSIS — Z955 Presence of coronary angioplasty implant and graft: Secondary | ICD-10-CM

## 2021-09-08 DIAGNOSIS — Z48812 Encounter for surgical aftercare following surgery on the circulatory system: Secondary | ICD-10-CM | POA: Diagnosis not present

## 2021-09-10 ENCOUNTER — Encounter (HOSPITAL_COMMUNITY)
Admission: RE | Admit: 2021-09-10 | Discharge: 2021-09-10 | Disposition: A | Discharge status: Home / Self Care | Payer: Medicare Other | Source: Ambulatory Visit | Attending: Cardiology | Admitting: Cardiology

## 2021-09-10 DIAGNOSIS — Z955 Presence of coronary angioplasty implant and graft: Secondary | ICD-10-CM

## 2021-09-10 DIAGNOSIS — Z48812 Encounter for surgical aftercare following surgery on the circulatory system: Secondary | ICD-10-CM | POA: Diagnosis not present

## 2021-09-13 ENCOUNTER — Encounter (HOSPITAL_COMMUNITY)
Admission: RE | Admit: 2021-09-13 | Discharge: 2021-09-13 | Disposition: A | Discharge status: Home / Self Care | Payer: Medicare Other | Source: Ambulatory Visit | Attending: Cardiology | Admitting: Cardiology

## 2021-09-13 DIAGNOSIS — Z48812 Encounter for surgical aftercare following surgery on the circulatory system: Secondary | ICD-10-CM | POA: Diagnosis not present

## 2021-09-13 DIAGNOSIS — Z955 Presence of coronary angioplasty implant and graft: Secondary | ICD-10-CM

## 2021-09-14 NOTE — Progress Notes (Signed)
Cardiac Individual Treatment Plan ? ?Patient Details  ?Name: Alexander Burns ?MRN: [id number] ?Date of Birth: 05/17/1951 ?Referring Provider:   ?Flowsheet Row CARDIAC REHAB PHASE II ORIENTATION from 08/19/2021 in [facility]  ?Referring Provider Vernell Leep, MD  ? ?  ? ? ?Initial Encounter Date:  ?Flowsheet Row CARDIAC REHAB PHASE II ORIENTATION from 08/19/2021 in [facility]  ?Date 08/19/21  ? ?  ? ? ?Visit Diagnosis: 05/07/21 S/P DES x 2 LAD ? ?Patient's Home Medications on Admission: ? ?Current Outpatient Medications:  ?  albuterol (VENTOLIN HFA) 108 (90 Base) MCG/ACT inhaler, Inhale 2 puffs into the lungs every 4 (four) hours as needed for wheezing or shortness of breath., Disp: 18 g, Rfl: 1 ?  amLODipine (NORVASC) 5 MG tablet, Take 5 mg by mouth in the morning., Disp: , Rfl:  ?  aspirin EC 81 MG tablet, Take 1 tablet (81 mg total) by mouth daily. Swallow whole., Disp: 90 tablet, Rfl: 3 ?  Budeson-Glycopyrrol-Formoterol (BREZTRI AEROSPHERE) 160-9-4.8 MCG/ACT AERO, Inhale 2 puffs into the lungs in the morning and at bedtime., Disp: 11 g, Rfl: 5 ?  cholecalciferol (VITAMIN D) 1000 UNITS tablet, Take 1,000 Units by mouth every morning. , Disp: , Rfl:  ?  fluticasone (FLONASE) 50 MCG/ACT nasal spray, Place 1 spray into both nostrils 2 (two) times daily as needed for allergies or rhinitis., Disp: 16 g, Rfl: 5 ?  pantoprazole (PROTONIX) 40 MG tablet, Take 40 mg by mouth daily., Disp: , Rfl:  ?  Propylene Glycol 0.6 % SOLN, Place 1 drop into both eyes 4 (four) times daily as needed (dry/irritated eyes)., Disp: , Rfl:  ? ?Past Medical History: ?Past Medical History:  ?Diagnosis Date  ? Asthma   ? Bladder cancer ([place]) 2018  ? Coronary artery disease   ? Hypertension   ? ? ?Tobacco Use: ?Social History  ? ?Tobacco Use  ?Smoking Status Former  ? Packs/day: 1.00  ? Years: 3.00  ? Pack years: 3.00  ? Types: Cigarettes, Cigars  ? Quit date: 2000  ? Years since  quitting: 23.2  ?Smokeless Tobacco Never  ? ? ?Labs: ?Review Flowsheet   ? ?  ?  07/30/2007  ?Labs for [facility]P Cardiac and Pulmonary Rehab  ?Bicarbonate 26.9    ?TCO2 28    ?  ? ? Multiple values from one day are sorted in reverse-chronological order  ?  ?  ? ? ?Capillary Blood Glucose: ?No results found for: GLUCAP ? ? ?Exercise Target Goals: ?Exercise Program Goal: ?Individual exercise prescription set using results from initial 6 min walk test and THRR while considering  patient?s activity barriers and safety.  ? ?Exercise Prescription Goal: ?Initial exercise prescription builds to 30-45 minutes a day of aerobic activity, 2-3 days per week.  Home exercise guidelines will be given to patient during program as part of exercise prescription that the participant will acknowledge. ? ?Activity Barriers & Risk Stratification: ? Activity Barriers & Cardiac Risk Stratification - 08/19/21 1453   ? ?  ? Activity Barriers & Cardiac Risk Stratification  ? Activity Barriers Back Problems   ? Cardiac Risk Stratification Moderate   ? ?  ?  ? ?  ? ? ?6 Minute Walk: ? 6 Minute Walk   ? ? [place] Name 08/19/21 1452  ?  ?  ?  ? 6 Minute Walk  ? Phase Initial    ? Distance 1358 feet    ? Walk Time 6 minutes    ? #  of Rest Breaks 0    ? MPH 2.57    ? METS 2.88    ? RPE 9    ? Perceived Dyspnea  0    ? VO2 Peak 10.1    ? Symptoms No    ? Resting HR 56 bpm    ? Resting BP 124/70    ? Resting Oxygen Saturation  98 %    ? Exercise Oxygen Saturation  during 6 min walk 98 %    ? Max Ex. HR 88 bpm    ? Max Ex. BP 142/76    ? 2 Minute Post BP 132/70    ? ?  ?  ? ?  ? ? ?Oxygen Initial Assessment: ? ? ?Oxygen Re-Evaluation: ? ? ?Oxygen Discharge (Final Oxygen Re-Evaluation): ? ? ?Initial Exercise Prescription: ? Initial Exercise Prescription - 08/19/21 1400   ? ?  ? Date of Initial Exercise RX and Referring Provider  ? Date 08/19/21   ? Referring Provider Vernell Leep, MD   ? Expected Discharge Date 10/15/21   ?  ? Treadmill  ? MPH 2.5   ? Grade 0    ? Minutes 15   ? METs 2.91   ?  ? Bike  ? Level 1   ? Minutes 15   ? METs 2.5   ?  ? Prescription Details  ? Frequency (times per week) 3   ? Duration Progress to 30 minutes of continuous aerobic without signs/symptoms of physical distress   ?  ? Intensity  ? THRR 40-80% of Max Heartrate 60-119   ? Ratings of Perceived Exertion 11-13   ? Perceived Dyspnea 0-4   ?  ? Progression  ? Progression Continue progressive overload as per policy without signs/symptoms or physical distress.   ?  ? Resistance Training  ? Training Prescription Yes   ? Weight 5 lbs   ? Reps 10-15   ? ?  ?  ? ?  ? ? ?Perform Capillary Blood Glucose checks as needed. ? ?Exercise Prescription Changes: ? ? Exercise Prescription Changes   ? ? [place] Name 08/23/21 1019 08/30/21 1024 09/13/21 1020  ?  ?  ?  ? Response to Exercise  ? Blood Pressure (Admit) 142/78 128/70 118/68    ? Blood Pressure (Exercise) 164/72 168/82 164/82    ? Blood Pressure (Exit) 128/72 128/62 118/64    ? Heart Rate (Admit) 65 bpm 68 bpm 66 bpm    ? Heart Rate (Exercise) 103 bpm 108 bpm 102 bpm    ? Heart Rate (Exit) 64 bpm 68 bpm 65 bpm    ? Rating of Perceived Exertion (Exercise) '7 11 12    '$ ? Symptoms None None None    ? Comments Off to a great start with exercise. -- --    ? Duration Continue with 30 min of aerobic exercise without signs/symptoms of physical distress. Continue with 30 min of aerobic exercise without signs/symptoms of physical distress. Continue with 30 min of aerobic exercise without signs/symptoms of physical distress.    ? Intensity THRR unchanged THRR unchanged THRR unchanged    ?  ? Progression  ? Progression Continue to progress workloads to maintain intensity without signs/symptoms of physical distress. Continue to progress workloads to maintain intensity without signs/symptoms of physical distress. Continue to progress workloads to maintain intensity without signs/symptoms of physical distress.    ? Average METs 3 3.8 3.9    ?  ? Resistance Training  ?  Training Prescription Yes  Yes Yes    ? Weight 5 lbs 5 lbs 5 lbs    ? Reps 10-15 10-15 10-15    ? Time 10 Minutes 10 Minutes 10 Minutes    ?  ? Interval Training  ? Interval Training No No No    ?  ? Treadmill  ? MPH 2.5 2.8 2.8    ? Grade 0 1 1    ? Minutes '15 15 15    '$ ? METs 2.91 3.53 3.53    ?  ? Bike  ? Level 1.1 1.5 1.6    ? Minutes '15 15 15    '$ ? METs 3.18 4.05 4.27    ?  ? Home Exercise Plan  ? Plans to continue exercise at -- -- Home (comment)  Walking, biking    ? Frequency -- -- Add 2 additional days to program exercise sessions.    ? Initial Home Exercises Provided -- -- 09/06/21    ? ?  ?  ? ?  ? ? ?Exercise Comments: ? ? Exercise Comments   ? ? [place] Name 08/23/21 1129 09/01/21 1054 09/06/21 1107 09/13/21 1053  ?  ? Exercise Comments Patient tolerated 1st session of exercise well without symptoms. Reviewed METs with patient. Reviewed home exercise guidelines and goals with patient. Reviewed METs and pulse counting with patient.   ? ?  ?  ? ?  ? ? ?Exercise Goals and Review: ? ? Exercise Goals   ? ? [place] Name 08/19/21 1454  ?  ?  ?  ?  ?  ? Exercise Goals  ? Increase Physical Activity Yes      ? Intervention Provide advice, education, support and counseling about physical activity/exercise needs.;Develop an individualized exercise prescription for aerobic and resistive training based on initial evaluation findings, risk stratification, comorbidities and participant's personal goals.      ? Expected Outcomes Short Term: Attend rehab on a regular basis to increase amount of physical activity.;Long Term: Add in home exercise to make exercise part of routine and to increase amount of physical activity.;Long Term: Exercising regularly at least 3-5 days a week.      ? Increase Strength and Stamina Yes      ? Intervention Provide advice, education, support and counseling about physical activity/exercise needs.;Develop an individualized exercise prescription for aerobic and resistive training based on initial  evaluation findings, risk stratification, comorbidities and participant's personal goals.      ? Expected Outcomes Short Term: Increase workloads from initial exercise prescription for resistance, speed, and

## 2021-09-14 NOTE — Progress Notes (Signed)
Alexander Burns Dragon 71 y.o. male ?Nutrition Note ?[name] is motivated to make lifestyle changes to aid with cardiac/pulmonary rehab. Patient has medical history of chronic diastolic CHF, CAD, unstable angina, HTN, mixed hyperlipidemia, hx of bladder cancer. He lives at home with his wife; he reports that his wife has HTN and is supportive of lifestyle changes. He has made many lifestyle changes including reduced red meat, reduced refined carbohydrates (creamed potatoes, mac and cheese,etc), eliminated meal skipping, and increased fiber through fruits and vegetables. He drinks an Ensure daily for breakfast.  ? ?Nutrition Diagnosis ?(Improving) Excessive sodium intake related to over consumption of processed food as evidenced by frequent consumption of convenience food/ canned vegetables and eating out frequently. ?(Improving) Inappropriate intake of saturated fats related to food related knowledge deficit as evidenced by pt's diet recall and history of elevated LDL ? ?Nutrition Intervention ?Pt?s individual nutrition plan reviewed with pt. ?Benefits of adopting Heart Healthy diet discussed.  ?Continue client-centered nutrition education by RD, as part of interdisciplinary care. ? ?Monitor/Evaluation: ?Patient reports motivation to make lifestyle changes for adherence to heart healthy diet recommendation, blood sugar control, and weight management. We discussed/reviewed lifestyle changes including both starchy and non-starchy vegetable intake, breakfast ideas/protein supplements, lean protein, sodium intake. Answered patient's questions about protein drinks/meal replacements that he was already enjoying at breakfast. Reviewed the plate method as a guide for meal planning. Discussed strategies for long term sustainability. Handouts/notes given. Patient amicable to RD suggestions and verbalizes understanding. Will follow-up as needed.  ? ?10 minutes spent in review of topics related to a heart healthy diet including  sodium intake, blood sugar control, weight management, and fiber intake. ? ?Goal(s) ?Pt to identify and limit food sources of saturated fat, trans fat, refined carbohydrates and sodium ?Pt able to name foods that affect blood glucose. Continue to limit simple sugars, refined carbohydrates, sugary beverages, etc.  ?Pt to describe the benefit of including lean protein/plant proteins, fruits, vegetables, whole grains, nuts/seeds, and low-fat dairy products in a heart healthy meal plan.  ?Pt to practice mindful and intuitive eating exercises ? ?Plan:  ?Will provide client-centered nutrition education as part of interdisciplinary care ?Monitor and evaluate progress toward nutrition goal with team. ? ? ?Aldona Bar [place], MS, RDN, LDN ? ?

## 2021-09-15 ENCOUNTER — Encounter (HOSPITAL_COMMUNITY)
Admission: RE | Admit: 2021-09-15 | Discharge: 2021-09-15 | Disposition: A | Discharge status: Home / Self Care | Payer: Medicare Other | Source: Ambulatory Visit | Attending: Cardiology | Admitting: Cardiology

## 2021-09-15 DIAGNOSIS — Z48812 Encounter for surgical aftercare following surgery on the circulatory system: Secondary | ICD-10-CM | POA: Diagnosis not present

## 2021-09-15 DIAGNOSIS — Z955 Presence of coronary angioplasty implant and graft: Secondary | ICD-10-CM

## 2021-09-17 ENCOUNTER — Encounter (HOSPITAL_COMMUNITY)
Admission: RE | Admit: 2021-09-17 | Discharge: 2021-09-17 | Disposition: A | Discharge status: Home / Self Care | Payer: Medicare Other | Source: Ambulatory Visit | Attending: Cardiology | Admitting: Cardiology

## 2021-09-17 DIAGNOSIS — Z955 Presence of coronary angioplasty implant and graft: Secondary | ICD-10-CM

## 2021-09-17 DIAGNOSIS — Z48812 Encounter for surgical aftercare following surgery on the circulatory system: Secondary | ICD-10-CM | POA: Diagnosis not present

## 2021-09-19 ENCOUNTER — Other Ambulatory Visit: Payer: Self-pay | Admitting: Cardiology

## 2021-09-20 ENCOUNTER — Encounter (HOSPITAL_COMMUNITY)
Admission: RE | Admit: 2021-09-20 | Discharge: 2021-09-20 | Disposition: A | Discharge status: Home / Self Care | Payer: Medicare Other | Source: Ambulatory Visit | Attending: Cardiology | Admitting: Cardiology

## 2021-09-20 ENCOUNTER — Other Ambulatory Visit: Payer: Self-pay | Admitting: Cardiology

## 2021-09-20 DIAGNOSIS — Z955 Presence of coronary angioplasty implant and graft: Secondary | ICD-10-CM

## 2021-09-20 DIAGNOSIS — Z48812 Encounter for surgical aftercare following surgery on the circulatory system: Secondary | ICD-10-CM | POA: Diagnosis not present

## 2021-09-22 ENCOUNTER — Encounter (HOSPITAL_COMMUNITY)
Admission: RE | Admit: 2021-09-22 | Discharge: 2021-09-22 | Disposition: A | Discharge status: Home / Self Care | Payer: Medicare Other | Source: Ambulatory Visit | Attending: Cardiology | Admitting: Cardiology

## 2021-09-22 DIAGNOSIS — Z955 Presence of coronary angioplasty implant and graft: Secondary | ICD-10-CM

## 2021-09-22 DIAGNOSIS — Z48812 Encounter for surgical aftercare following surgery on the circulatory system: Secondary | ICD-10-CM | POA: Diagnosis not present

## 2021-09-24 ENCOUNTER — Encounter (HOSPITAL_COMMUNITY)
Admission: RE | Admit: 2021-09-24 | Discharge: 2021-09-24 | Disposition: A | Discharge status: Home / Self Care | Payer: Medicare Other | Source: Ambulatory Visit | Attending: Cardiology | Admitting: Cardiology

## 2021-09-24 DIAGNOSIS — Z48812 Encounter for surgical aftercare following surgery on the circulatory system: Secondary | ICD-10-CM | POA: Diagnosis not present

## 2021-09-24 DIAGNOSIS — Z955 Presence of coronary angioplasty implant and graft: Secondary | ICD-10-CM

## 2021-09-25 ENCOUNTER — Other Ambulatory Visit: Payer: Self-pay | Admitting: Cardiology

## 2021-09-25 DIAGNOSIS — I2510 Atherosclerotic heart disease of native coronary artery without angina pectoris: Secondary | ICD-10-CM

## 2021-09-27 ENCOUNTER — Encounter (HOSPITAL_COMMUNITY)
Admission: RE | Admit: 2021-09-27 | Discharge: 2021-09-27 | Disposition: A | Discharge status: Home / Self Care | Payer: Medicare Other | Source: Ambulatory Visit | Attending: Cardiology | Admitting: Cardiology

## 2021-09-27 DIAGNOSIS — Z955 Presence of coronary angioplasty implant and graft: Secondary | ICD-10-CM

## 2021-09-27 DIAGNOSIS — Z48812 Encounter for surgical aftercare following surgery on the circulatory system: Secondary | ICD-10-CM | POA: Diagnosis not present

## 2021-09-29 ENCOUNTER — Encounter (HOSPITAL_COMMUNITY)
Admission: RE | Admit: 2021-09-29 | Discharge: 2021-09-29 | Disposition: A | Discharge status: Home / Self Care | Payer: Medicare Other | Source: Ambulatory Visit | Attending: Cardiology | Admitting: Cardiology

## 2021-09-29 DIAGNOSIS — Z48812 Encounter for surgical aftercare following surgery on the circulatory system: Secondary | ICD-10-CM | POA: Diagnosis not present

## 2021-09-29 DIAGNOSIS — Z955 Presence of coronary angioplasty implant and graft: Secondary | ICD-10-CM

## 2021-10-01 ENCOUNTER — Encounter (HOSPITAL_COMMUNITY)
Admission: RE | Admit: 2021-10-01 | Discharge: 2021-10-01 | Disposition: A | Discharge status: Home / Self Care | Payer: Medicare Other | Source: Ambulatory Visit | Attending: Cardiology | Admitting: Cardiology

## 2021-10-01 DIAGNOSIS — Z48812 Encounter for surgical aftercare following surgery on the circulatory system: Secondary | ICD-10-CM | POA: Diagnosis not present

## 2021-10-01 DIAGNOSIS — Z955 Presence of coronary angioplasty implant and graft: Secondary | ICD-10-CM

## 2021-10-04 ENCOUNTER — Encounter (HOSPITAL_COMMUNITY)
Admission: RE | Admit: 2021-10-04 | Discharge: 2021-10-04 | Disposition: A | Discharge status: Home / Self Care | Payer: Medicare Other | Source: Ambulatory Visit | Attending: Cardiology | Admitting: Cardiology

## 2021-10-04 DIAGNOSIS — Z48812 Encounter for surgical aftercare following surgery on the circulatory system: Secondary | ICD-10-CM | POA: Insufficient documentation

## 2021-10-04 DIAGNOSIS — I10 Essential (primary) hypertension: Secondary | ICD-10-CM | POA: Diagnosis not present

## 2021-10-04 DIAGNOSIS — Z955 Presence of coronary angioplasty implant and graft: Secondary | ICD-10-CM | POA: Diagnosis present

## 2021-10-04 NOTE — Progress Notes (Signed)
QUALITY OF LIFE SCORE REVIEW ? Tennyson completed Quality of Life survey as a participant in Cardiac Rehab.  Scores 21.0 or below are considered low.  Pt score very low in several areas Overall 22.36, Health and Function 20.77, socioeconomic 21.67, physiological and spiritual 28.43, family 19.5. Patient quality of life slightly altered by physical constraints which limits ability to perform as prior to recent cardiac illness.Aniketh did have a decrease in his quality of life due to personal reasons he chose not to disclose. Sabri says that cardiac rehab has been helpful to him.  Offered emotional support and reassurance.  Will continue to monitor and intervene as necessary. Tagg will complete cardiac rehab at the end of the week.Barnet Pall, RN,BSN ?10/04/2021 11:20 AM  ?

## 2021-10-06 ENCOUNTER — Encounter (HOSPITAL_COMMUNITY)
Admission: RE | Admit: 2021-10-06 | Discharge: 2021-10-06 | Disposition: A | Discharge status: Home / Self Care | Payer: Medicare Other | Source: Ambulatory Visit | Attending: Cardiology | Admitting: Cardiology

## 2021-10-06 DIAGNOSIS — Z48812 Encounter for surgical aftercare following surgery on the circulatory system: Secondary | ICD-10-CM | POA: Diagnosis not present

## 2021-10-06 DIAGNOSIS — Z955 Presence of coronary angioplasty implant and graft: Secondary | ICD-10-CM

## 2021-10-06 NOTE — Progress Notes (Incomplete)
Discharge Progress Report ? ?Patient Details  ?Name: Alexander Burns ?MRN: [id number] ?Date of Birth: 1951-04-05 ?Referring Provider:   ?Flowsheet Row CARDIAC REHAB PHASE II ORIENTATION from 08/19/2021 in [place]  ?Referring Provider Vernell Leep, MD  ? ?  ? ? ? ?Number of Visits: *** ? ?Reason for Discharge:  ?{[facility] AMB CP REHAB REASON FOR DISCHARGE:[phone]} ? ?Smoking History:  ?Social History  ? ?Tobacco Use  ?Smoking Status Former  ? Packs/day: 1.00  ? Years: 3.00  ? Pack years: 3.00  ? Types: Cigarettes, Cigars  ? Quit date: 2000  ? Years since quitting: 23.3  ?Smokeless Tobacco Never  ? ? ?Diagnosis:  ?05/07/21 S/P DES x 2 LAD ? ?ADL UCSD: ? ? ?Initial Exercise Prescription: ? Initial Exercise Prescription - 08/19/21 1400   ? ?  ? Date of Initial Exercise RX and Referring Provider  ? Date 08/19/21   ? Referring Provider Vernell Leep, MD   ? Expected Discharge Date 10/15/21   ?  ? Treadmill  ? MPH 2.5   ? Grade 0   ? Minutes 15   ? METs 2.91   ?  ? Bike  ? Level 1   ? Minutes 15   ? METs 2.5   ?  ? Prescription Details  ? Frequency (times per week) 3   ? Duration Progress to 30 minutes of continuous aerobic without signs/symptoms of physical distress   ?  ? Intensity  ? THRR 40-80% of Max Heartrate 60-119   ? Ratings of Perceived Exertion 11-13   ? Perceived Dyspnea 0-4   ?  ? Progression  ? Progression Continue progressive overload as per policy without signs/symptoms or physical distress.   ?  ? Resistance Training  ? Training Prescription Yes   ? Weight 5 lbs   ? Reps 10-15   ? ?  ?  ? ?  ? ? ?Discharge Exercise Prescription (Final Exercise Prescription Changes): ? Exercise Prescription Changes - 09/27/21 1022   ? ?  ? Response to Exercise  ? Blood Pressure (Admit) 128/62   ? Blood Pressure (Exercise) 168/82   ? Blood Pressure (Exit) 128/64   ? Heart Rate (Admit) 59 bpm   ? Heart Rate (Exercise) 118 bpm   ? Heart Rate (Exit) 69 bpm   ? Rating of Perceived Exertion  (Exercise) 12   ? Symptoms None   ? Duration Continue with 30 min of aerobic exercise without signs/symptoms of physical distress.   ? Intensity THRR unchanged   ?  ? Progression  ? Progression Continue to progress workloads to maintain intensity without signs/symptoms of physical distress.   ? Average METs 4   ?  ? Resistance Training  ? Training Prescription Yes   ? Weight 5 lbs   ? Reps 10-15   ? Time 10 Minutes   ?  ? Interval Training  ? Interval Training No   ?  ? Treadmill  ? MPH 2.8   ? Grade 1   Inc grad to 2% at 8 min, Inc to 3% at 11 mins  ? Minutes 15   ? METs 3.91   ?  ? Bike  ? Level 1.6   ? Minutes 15   ? METs 4.27   ?  ? Home Exercise Plan  ? Plans to continue exercise at Home (comment)   Walking, biking  ? Frequency Add 2 additional days to program exercise sessions.   ? Initial Home Exercises Provided  09/06/21   ? ?  ?  ? ?  ? ? ?Functional Capacity: ? 6 Minute Walk   ? ? [place] Name 08/19/21 1452  ?  ?  ?  ? 6 Minute Walk  ? Phase Initial    ? Distance 1358 feet    ? Walk Time 6 minutes    ? # of Rest Breaks 0    ? MPH 2.57    ? METS 2.88    ? RPE 9    ? Perceived Dyspnea  0    ? VO2 Peak 10.1    ? Symptoms No    ? Resting HR 56 bpm    ? Resting BP 124/70    ? Resting Oxygen Saturation  98 %    ? Exercise Oxygen Saturation  during 6 min walk 98 %    ? Max Ex. HR 88 bpm    ? Max Ex. BP 142/76    ? 2 Minute Post BP 132/70    ? ?  ?  ? ?  ? ? ?Psychological, QOL, Others - Outcomes: ?PHQ 2/9: ? ?  10/06/2021  ? 11:34 AM 08/19/2021  ?  3:25 PM  ?Depression screen PHQ 2/9  ?Decreased Interest 0 0  ?Down, Depressed, Hopeless 0 0  ?PHQ - 2 Score 0 0  ? ? ?Quality of Life: ? Quality of Life - 10/01/21 1658   ? ?  ? Quality of Life  ? Select Quality of Life   ?  ? Quality of Life Scores  ? Health/Function Pre 24.33 %   ? Health/Function Post 20.77 %   ? Health/Function % Change -14.63 %   ? Socioeconomic Pre 28.93 %   ? Socioeconomic Post 21.67 %   ? Socioeconomic % Change  -25.1 %   ? Psych/Spiritual Pre 29.14 %    ? Psych/Spiritual Post 28.43 %   ? Psych/Spiritual % Change -2.44 %   ? Family Pre 28.2 %   ? Family Post 19.5 %   ? Family % Change -30.85 %   ? GLOBAL Pre 26.84 %   ? GLOBAL Post 22.36 %   ? GLOBAL % Change -16.69 %   ? ?  ?  ? ?  ? ? ?Personal Goals: ?Goals established at orientation with interventions provided to work toward goal. ? Personal Goals and Risk Factors at Admission - 08/19/21 1447   ? ?  ? Core Components/Risk Factors/Patient Goals on Admission  ?  Weight Management Yes;Weight Loss   ? Intervention Weight Management: Develop a combined nutrition and exercise program designed to reach desired caloric intake, while maintaining appropriate intake of nutrient and fiber, sodium and fats, and appropriate energy expenditure required for the weight goal.;Weight Management: Provide education and appropriate resources to help participant work on and attain dietary goals.;Weight Management/Obesity: Establish reasonable short term and long term weight goals.   ? Admit Weight 203 lb 11.3 oz (92.4 kg)   ? Expected Outcomes Short Term: Continue to assess and modify interventions until short term weight is achieved;Long Term: Adherence to nutrition and physical activity/exercise program aimed toward attainment of established weight goal;Weight Maintenance: Understanding of the daily nutrition guidelines, which includes 25-35% calories from fat, 7% or less cal from saturated fats, less than '200mg'$  cholesterol, less than 1.5gm of sodium, & 5 or more servings of fruits and vegetables daily;Weight Loss: Understanding of general recommendations for a balanced deficit meal plan, which promotes 1-2 lb weight loss per week and includes a  negative energy balance of [phone] kcal/d;Understanding recommendations for meals to include 15-35% energy as protein, 25-35% energy from fat, 35-60% energy from carbohydrates, less than '200mg'$  of dietary cholesterol, 20-35 gm of total fiber daily;Understanding of distribution of calorie  intake throughout the day with the consumption of 4-5 meals/snacks   ? Hypertension Yes   ? Intervention Provide education on lifestyle modifcations including regular physical activity/exercise, weight management, moderate sodium restriction and increased consumption of fresh fruit, vegetables, and low fat dairy, alcohol moderation, and smoking cessation.;Monitor prescription use compliance.   ? Expected Outcomes Short Term: Continued assessment and intervention until BP is < 140/33m HG in hypertensive participants. < 130/865mHG in hypertensive participants with diabetes, heart failure or chronic kidney disease.;Long Term: Maintenance of blood pressure at goal levels.   ? Lipids Yes   ? Intervention Provide education and support for participant on nutrition & aerobic/resistive exercise along with prescribed medications to achieve LDL '70mg'$ , HDL >'40mg'$ .   ? Expected Outcomes Short Term: Participant states understanding of desired cholesterol values and is compliant with medications prescribed. Participant is following exercise prescription and nutrition guidelines.;Long Term: Cholesterol controlled with medications as prescribed, with individualized exercise RX and with personalized nutrition plan. Value goals: LDL < '70mg'$ , HDL > 40 mg.   ? ?  ?  ? ?  ?  ? ?Personal Goals Discharge: ? Goals and Risk Factor Review   ? ? RoO'Briename 08/23/21 1418 09/14/21 1545  ?  ?  ?  ?  ? Core Components/Risk Factors/Patient Goals Review  ? Personal Goals Review Weight Management/Obesity;Lipids;Hypertension Weight Management/Obesity;Lipids;Hypertension     ? Review RoLangdontarted cardiac rehab on 08/23/21. Reilly did well with exercise. Will montior BP as RoHerbie [place] some moderate BP elevations RoBowmanas been doing well with exercise, Some intermittent exertional BP elevations noted. Will continue to monitor BP.     ? Expected Outcomes RoKemoill continue to participate in phase 2 cardiac rehab for exercise, nutrition and lifestyle  modifications RoTavariill continue to participate in phase 2 cardiac rehab for exercise, nutrition and lifestyle modifications     ? ?  ?  ? ?  ? ? ?Exercise Goals and Review: ? Exercise Goals   ? ? Ro

## 2021-10-08 ENCOUNTER — Encounter (HOSPITAL_COMMUNITY)
Admission: RE | Admit: 2021-10-08 | Discharge: 2021-10-08 | Disposition: A | Discharge status: Home / Self Care | Payer: Medicare Other | Source: Ambulatory Visit | Attending: Cardiology | Admitting: Cardiology

## 2021-10-08 VITALS — Wt 204.8 lb

## 2021-10-08 DIAGNOSIS — Z955 Presence of coronary angioplasty implant and graft: Secondary | ICD-10-CM

## 2021-10-08 DIAGNOSIS — Z48812 Encounter for surgical aftercare following surgery on the circulatory system: Secondary | ICD-10-CM | POA: Diagnosis not present

## 2021-10-11 ENCOUNTER — Encounter (HOSPITAL_COMMUNITY)
Admission: RE | Admit: 2021-10-11 | Discharge: 2021-10-11 | Disposition: A | Discharge status: Home / Self Care | Payer: Medicare Other | Source: Ambulatory Visit | Attending: Cardiology | Admitting: Cardiology

## 2021-10-11 DIAGNOSIS — Z955 Presence of coronary angioplasty implant and graft: Secondary | ICD-10-CM

## 2021-10-11 DIAGNOSIS — Z48812 Encounter for surgical aftercare following surgery on the circulatory system: Secondary | ICD-10-CM | POA: Diagnosis not present

## 2021-10-11 NOTE — Progress Notes (Signed)
Cardiac Individual Treatment Plan ? ?Patient Details  ?Name: Alexander Burns ?MRN: [id number] ?Date of Birth: 1951-02-04 ?Referring Provider:   ?Flowsheet Row CARDIAC REHAB PHASE II ORIENTATION from 08/19/2021 in [place]  ?Referring Provider Vernell Leep, MD  ? ?  ? ? ?Initial Encounter Date:  ?Flowsheet Row CARDIAC REHAB PHASE II ORIENTATION from 08/19/2021 in [place]  ?Date 08/19/21  ? ?  ? ? ?Visit Diagnosis: 05/07/21 S/P DES x 2 LAD ? ?Patient's Home Medications on Admission: ? ?Current Outpatient Medications:  ?  albuterol (VENTOLIN HFA) 108 (90 Base) MCG/ACT inhaler, Inhale 2 puffs into the lungs every 4 (four) hours as needed for wheezing or shortness of breath. (Patient not taking: Reported on 10/06/2021), Disp: 18 g, Rfl: 1 ?  amLODipine (NORVASC) 5 MG tablet, Take 5 mg by mouth in the morning., Disp: , Rfl:  ?  aspirin EC 81 MG tablet, Take 1 tablet (81 mg total) by mouth daily. Swallow whole., Disp: 90 tablet, Rfl: 3 ?  Budeson-Glycopyrrol-Formoterol (BREZTRI AEROSPHERE) 160-9-4.8 MCG/ACT AERO, Inhale 2 puffs into the lungs in the morning and at bedtime. (Patient not taking: Reported on 10/06/2021), Disp: 11 g, Rfl: 5 ?  cholecalciferol (VITAMIN D) 1000 UNITS tablet, Take 1,000 Units by mouth every morning. , Disp: , Rfl:  ?  fluticasone (FLONASE) 50 MCG/ACT nasal spray, Place 1 spray into both nostrils 2 (two) times daily as needed for allergies or rhinitis., Disp: 16 g, Rfl: 5 ?  pantoprazole (PROTONIX) 40 MG tablet, Take 40 mg by mouth daily., Disp: , Rfl:  ?  Propylene Glycol 0.6 % SOLN, Place 1 drop into both eyes 4 (four) times daily as needed (dry/irritated eyes)., Disp: , Rfl:  ? ?Past Medical History: ?Past Medical History:  ?Diagnosis Date  ? Asthma   ? Bladder cancer ([place]) 2018  ? Coronary artery disease   ? Hypertension   ? ? ?Tobacco Use: ?Social History  ? ?Tobacco Use  ?Smoking Status Former  ? Packs/day: 1.00  ? Years:  3.00  ? Pack years: 3.00  ? Types: Cigarettes, Cigars  ? Quit date: 2000  ? Years since quitting: 23.3  ?Smokeless Tobacco Never  ? ? ?Labs: ?Review Flowsheet   ? ?  ?  07/30/2007  ?Labs for ITP Cardiac and Pulmonary Rehab  ?Bicarbonate 26.9    ?TCO2 28    ?  ? ? Multiple values from one day are sorted in reverse-chronological order  ?  ?  ? ? ?Capillary Blood Glucose: ?No results found for: GLUCAP ? ? ?Exercise Target Goals: ?Exercise Program Goal: ?Individual exercise prescription set using results from initial 6 min walk test and THRR while considering  patient?s activity barriers and safety.  ? ?Exercise Prescription Goal: ?Initial exercise prescription builds to 30-45 minutes a day of aerobic activity, 2-3 days per week.  Home exercise guidelines will be given to patient during program as part of exercise prescription that the participant will acknowledge. ? ?Activity Barriers & Risk Stratification: ? Activity Barriers & Cardiac Risk Stratification - 08/19/21 1453   ? ?  ? Activity Barriers & Cardiac Risk Stratification  ? Activity Barriers Back Problems   ? Cardiac Risk Stratification Moderate   ? ?  ?  ? ?  ? ? ?6 Minute Walk: ? 6 Minute Walk   ? ? [place] Name 08/19/21 1452 10/08/21 1037  ?  ?  ? 6 Minute Walk  ? Phase Initial Discharge   ?  Distance 1358 feet 1358 feet   ? Distance % Change -- 19.44 %   ? Distance Feet Change -- 264 ft   ? Walk Time 6 minutes 6 minutes   ? # of Rest Breaks 0 0   ? MPH 2.57 3.07   ? METS 2.88 3.19   ? RPE 9 10   ? Perceived Dyspnea  0 0   ? VO2 Peak 10.1 11.16   ? Symptoms No No   ? Resting HR 56 bpm 76 bpm   ? Resting BP 124/70 104/60   ? Resting Oxygen Saturation  98 % --   ? Exercise Oxygen Saturation  during 6 min walk 98 % --   ? Max Ex. HR 88 bpm 82 bpm   ? Max Ex. BP 142/76 130/82   ? 2 Minute Post BP 132/70 --   ? ?  ?  ? ?  ? ? ?Oxygen Initial Assessment: ? ? ?Oxygen Re-Evaluation: ? ? ?Oxygen Discharge (Final Oxygen Re-Evaluation): ? ? ?Initial Exercise Prescription: ?  Initial Exercise Prescription - 08/19/21 1400   ? ?  ? Date of Initial Exercise RX and Referring Provider  ? Date 08/19/21   ? Referring Provider Vernell Leep, MD   ? Expected Discharge Date 10/15/21   ?  ? Treadmill  ? MPH 2.5   ? Grade 0   ? Minutes 15   ? METs 2.91   ?  ? Bike  ? Level 1   ? Minutes 15   ? METs 2.5   ?  ? Prescription Details  ? Frequency (times per week) 3   ? Duration Progress to 30 minutes of continuous aerobic without signs/symptoms of physical distress   ?  ? Intensity  ? THRR 40-80% of Max Heartrate 60-119   ? Ratings of Perceived Exertion 11-13   ? Perceived Dyspnea 0-4   ?  ? Progression  ? Progression Continue progressive overload as per policy without signs/symptoms or physical distress.   ?  ? Resistance Training  ? Training Prescription Yes   ? Weight 5 lbs   ? Reps 10-15   ? ?  ?  ? ?  ? ? ?Perform Capillary Blood Glucose checks as needed. ? ?Exercise Prescription Changes: ? ? Exercise Prescription Changes   ? ? [place] Name 08/23/21 1019 08/30/21 1024 09/13/21 1020 09/27/21 1022 10/11/21 1023  ?  ? Response to Exercise  ? Blood Pressure (Admit) 142/78 128/70 118/68 128/62 138/72  ? Blood Pressure (Exercise) 164/72 168/82 164/82 168/82 168/84  ? Blood Pressure (Exit) 128/72 128/62 118/64 128/64 140/80  ? Heart Rate (Admit) 65 bpm 68 bpm 66 bpm 59 bpm 68 bpm  ? Heart Rate (Exercise) 103 bpm 108 bpm 102 bpm 118 bpm 99 bpm  ? Heart Rate (Exit) 64 bpm 68 bpm 65 bpm 69 bpm 65 bpm  ? Rating of Perceived Exertion (Exercise) '7 11 12 12 13  '$ ? Symptoms None None None None None  ? Comments Off to a great start with exercise. -- -- -- Patient gradually increases incline from 1-4%.  ? Duration Continue with 30 min of aerobic exercise without signs/symptoms of physical distress. Continue with 30 min of aerobic exercise without signs/symptoms of physical distress. Continue with 30 min of aerobic exercise without signs/symptoms of physical distress. Continue with 30 min of aerobic exercise without  signs/symptoms of physical distress. Continue with 30 min of aerobic exercise without signs/symptoms of physical distress.  ? Intensity THRR unchanged THRR  unchanged THRR unchanged THRR unchanged THRR unchanged  ?  ? Progression  ? Progression Continue to progress workloads to maintain intensity without signs/symptoms of physical distress. Continue to progress workloads to maintain intensity without signs/symptoms of physical distress. Continue to progress workloads to maintain intensity without signs/symptoms of physical distress. Continue to progress workloads to maintain intensity without signs/symptoms of physical distress. Continue to progress workloads to maintain intensity without signs/symptoms of physical distress.  ? Average METs 3 3.8 3.9 4 4.1  ?  ? Resistance Training  ? Training Prescription Yes Yes Yes Yes Yes  ? Weight 5 lbs 5 lbs 5 lbs 5 lbs 5 lbs  ? Reps 10-15 10-15 10-15 10-15 10-15  ? Time 10 Minutes 10 Minutes 10 Minutes 10 Minutes 10 Minutes  ?  ? Interval Training  ? Interval Training No No No No No  ?  ? Treadmill  ? MPH 2.5 2.8 2.8 2.8 2.8  ? Grade 0 '1 1 1  '$ Inc grad to 2% at 8 min, Inc to 3% at 11 mins 2  Pt increases incline 1% every 5 minutes from 1-3%, increased to 4% the last 2.5 minutes  ? Minutes '15 15 15 15 15  '$ ? METs 2.91 3.53 3.53 3.91 4.1  ?  ? Bike  ? Level 1.1 1.5 1.6 1.6 1.6  ? Minutes '15 15 15 15 15  '$ ? METs 3.18 4.05 4.27 4.27 4.25  ?  ? Home Exercise Plan  ? Plans to continue exercise at -- -- Home (comment)  Walking, biking Home (comment)  Walking, biking Home (comment)  Walking, biking  ? Frequency -- -- Add 2 additional days to program exercise sessions. Add 2 additional days to program exercise sessions. Add 2 additional days to program exercise sessions.  ? Initial Home Exercises Provided -- -- 09/06/21 09/06/21 09/06/21  ? ?  ?  ? ?  ? ? ?Exercise Comments: ? ? Exercise Comments   ? ? [place] Name 08/23/21 1129 09/01/21 1054 09/06/21 1107 09/13/21 1053 09/29/21 1100  ?  Exercise Comments Patient tolerated 1st session of exercise well without symptoms. Reviewed METs with patient. Reviewed home exercise guidelines and goals with patient. Reviewed METs and pulse counting with pa

## 2021-10-13 ENCOUNTER — Encounter (HOSPITAL_COMMUNITY)
Admission: RE | Admit: 2021-10-13 | Discharge: 2021-10-13 | Disposition: A | Discharge status: Home / Self Care | Payer: Medicare Other | Source: Ambulatory Visit | Attending: Cardiology | Admitting: Cardiology

## 2021-10-13 DIAGNOSIS — Z955 Presence of coronary angioplasty implant and graft: Secondary | ICD-10-CM

## 2021-10-13 DIAGNOSIS — Z48812 Encounter for surgical aftercare following surgery on the circulatory system: Secondary | ICD-10-CM | POA: Diagnosis not present

## 2021-10-15 ENCOUNTER — Encounter (HOSPITAL_COMMUNITY)
Admission: RE | Admit: 2021-10-15 | Discharge: 2021-10-15 | Disposition: A | Discharge status: Home / Self Care | Payer: Medicare Other | Source: Ambulatory Visit | Attending: Cardiology | Admitting: Cardiology

## 2021-10-15 VITALS — BP 132/70 | HR 70 | Ht 70.5 in | Wt 198.4 lb

## 2021-10-15 DIAGNOSIS — Z955 Presence of coronary angioplasty implant and graft: Secondary | ICD-10-CM

## 2021-10-15 DIAGNOSIS — Z48812 Encounter for surgical aftercare following surgery on the circulatory system: Secondary | ICD-10-CM | POA: Diagnosis not present

## 2021-10-15 NOTE — Progress Notes (Signed)
Discharge Progress Report  Patient Details  Name: FREEDOM PEDDY MRN: [id number] Date of Birth: 1950/11/21 Referring Provider:   Flowsheet Row CARDIAC REHAB PHASE II ORIENTATION from 08/19/2021 in [place]  Referring Provider Vernell Leep, MD        Number of Visits: 24  Reason for Discharge:  Patient reached a stable level of exercise. Patient independent in their exercise. Patient has met program and personal goals.  Smoking History:  Social History   Tobacco Use  Smoking Status Former   Packs/day: 1.00   Years: 3.00   Pack years: 3.00   Types: Cigarettes, Cigars   Quit date: 2000   Years since quitting: 23.3  Smokeless Tobacco Never    Diagnosis:  05/07/21 S/P DES x 2 LAD  ADL UCSD:   Initial Exercise Prescription:  Initial Exercise Prescription - 08/19/21 1400       Date of Initial Exercise RX and Referring Provider   Date 08/19/21    Referring Provider Vernell Leep, MD    Expected Discharge Date 10/15/21      Treadmill   MPH 2.5    Grade 0    Minutes 15    METs 2.91      Bike   Level 1    Minutes 15    METs 2.5      Prescription Details   Frequency (times per week) 3    Duration Progress to 30 minutes of continuous aerobic without signs/symptoms of physical distress      Intensity   THRR 40-80% of Max Heartrate 60-119    Ratings of Perceived Exertion 11-13    Perceived Dyspnea 0-4      Progression   Progression Continue progressive overload as per policy without signs/symptoms or physical distress.      Resistance Training   Training Prescription Yes    Weight 5 lbs    Reps 10-15             Discharge Exercise Prescription (Final Exercise Prescription Changes):  Exercise Prescription Changes - 10/11/21 1023       Response to Exercise   Blood Pressure (Admit) 138/72    Blood Pressure (Exercise) 168/84    Blood Pressure (Exit) 140/80    Heart Rate (Admit) 68 bpm    Heart Rate  (Exercise) 99 bpm    Heart Rate (Exit) 65 bpm    Rating of Perceived Exertion (Exercise) 13    Symptoms None    Comments Patient gradually increases incline from 1-4%.    Duration Continue with 30 min of aerobic exercise without signs/symptoms of physical distress.    Intensity THRR unchanged      Progression   Progression Continue to progress workloads to maintain intensity without signs/symptoms of physical distress.    Average METs 4.1      Resistance Training   Training Prescription Yes    Weight 5 lbs    Reps 10-15    Time 10 Minutes      Interval Training   Interval Training No      Treadmill   MPH 2.8    Grade 2   Pt increases incline 1% every 5 minutes from 1-3%, increased to 4% the last 2.5 minutes   Minutes 15    METs 4.1      Bike   Level 1.6    Minutes 15    METs 4.25      Home Exercise Plan   Plans to continue exercise  at Home (comment)   Walking, biking   Frequency Add 2 additional days to program exercise sessions.    Initial Home Exercises Provided 09/06/21             Functional Capacity:  6 Minute Walk     Row Name 08/19/21 1452 10/08/21 1037       6 Minute Walk   Phase Initial Discharge    Distance 1358 feet 1358 feet    Distance % Change -- 19.44 %    Distance Feet Change -- 264 ft    Walk Time 6 minutes 6 minutes    # of Rest Breaks 0 0    MPH 2.57 3.07    METS 2.88 3.19    RPE 9 10    Perceived Dyspnea  0 0    VO2 Peak 10.1 11.16    Symptoms No No    Resting HR 56 bpm 76 bpm    Resting BP 124/70 104/60    Resting Oxygen Saturation  98 % --    Exercise Oxygen Saturation  during 6 min walk 98 % --    Max Ex. HR 88 bpm 82 bpm    Max Ex. BP 142/76 130/82    2 Minute Post BP 132/70 --             Psychological, QOL, Others - Outcomes: PHQ 2/9:    10/06/2021   11:34 AM 08/19/2021    3:25 PM  Depression screen PHQ 2/9  Decreased Interest 0 0  Down, Depressed, Hopeless 0 0  PHQ - 2 Score 0 0    Quality of Life:   Quality of Life - 10/01/21 1658       Quality of Life   Select Quality of Life      Quality of Life Scores   Health/Function Pre 24.33 %    Health/Function Post 20.77 %    Health/Function % Change -14.63 %    Socioeconomic Pre 28.93 %    Socioeconomic Post 21.67 %    Socioeconomic % Change  -25.1 %    Psych/Spiritual Pre 29.14 %    Psych/Spiritual Post 28.43 %    Psych/Spiritual % Change -2.44 %    Family Pre 28.2 %    Family Post 19.5 %    Family % Change -30.85 %    GLOBAL Pre 26.84 %    GLOBAL Post 22.36 %    GLOBAL % Change -16.69 %             Personal Goals: Goals established at orientation with interventions provided to work toward goal.  Personal Goals and Risk Factors at Admission - 08/19/21 1447       Core Components/Risk Factors/Patient Goals on Admission    Weight Management Yes;Weight Loss    Intervention Weight Management: Develop a combined nutrition and exercise program designed to reach desired caloric intake, while maintaining appropriate intake of nutrient and fiber, sodium and fats, and appropriate energy expenditure required for the weight goal.;Weight Management: Provide education and appropriate resources to help participant work on and attain dietary goals.;Weight Management/Obesity: Establish reasonable short term and long term weight goals.    Admit Weight 203 lb 11.3 oz (92.4 kg)    Expected Outcomes Short Term: Continue to assess and modify interventions until short term weight is achieved;Long Term: Adherence to nutrition and physical activity/exercise program aimed toward attainment of established weight goal;Weight Maintenance: Understanding of the daily nutrition guidelines, which includes 25-35% calories from fat, 7%  or less cal from saturated fats, less than 221m cholesterol, less than 1.5gm of sodium, & 5 or more servings of fruits and vegetables daily;Weight Loss: Understanding of general recommendations for a balanced deficit meal plan,  which promotes 1-2 lb weight loss per week and includes a negative energy balance of [phone] kcal/d;Understanding recommendations for meals to include 15-35% energy as protein, 25-35% energy from fat, 35-60% energy from carbohydrates, less than 2054mof dietary cholesterol, 20-35 gm of total fiber daily;Understanding of distribution of calorie intake throughout the day with the consumption of 4-5 meals/snacks    Hypertension Yes    Intervention Provide education on lifestyle modifcations including regular physical activity/exercise, weight management, moderate sodium restriction and increased consumption of fresh fruit, vegetables, and low fat dairy, alcohol moderation, and smoking cessation.;Monitor prescription use compliance.    Expected Outcomes Short Term: Continued assessment and intervention until BP is < 140/9050mG in hypertensive participants. < 130/72m44m in hypertensive participants with diabetes, heart failure or chronic kidney disease.;Long Term: Maintenance of blood pressure at goal levels.    Lipids Yes    Intervention Provide education and support for participant on nutrition & aerobic/resistive exercise along with prescribed medications to achieve LDL <70mg27mL >40mg.88mExpected Outcomes Short Term: Participant states understanding of desired cholesterol values and is compliant with medications prescribed. Participant is following exercise prescription and nutrition guidelines.;Long Term: Cholesterol controlled with medications as prescribed, with individualized exercise RX and with personalized nutrition plan. Value goals: LDL < 70mg, 50m> 40 mg.              Personal Goals Discharge:  Goals and Risk Factor Review     Row Name 08/23/21 1418 09/14/21 1545 10/12/21 0825         Core Components/Risk Factors/Patient Goals Review   Personal Goals Review Weight Management/Obesity;Lipids;Hypertension Weight Management/Obesity;Lipids;Hypertension Weight  Management/Obesity;Lipids;Hypertension     Review [facility] cardiac rehab on 08/23/21. Phu did well with exercise. Will montior BP as Alexandria Herbie [place] moderate BP elevations Shahzad Akilien doing well with exercise, Some intermittent exertional BP elevations noted. Will continue to monitor BP. Kanon Pauen doing very  well with exercise, Some intermittent exertional BP elevations continue. Resting blood pressures have been within normal limits. Bianca Lajarvisomplete phase 2 cardiac rehab on 10/15/21.     Expected Outcomes Zerek Matteoontinue to participate in phase 2 cardiac rehab for exercise, nutrition and lifestyle modifications Doss Ahaanontinue to participate in phase 2 cardiac rehab for exercise, nutrition and lifestyle modifications Quinntin Cliffontinue to exercise, follow nutrition and lifestyle modifications upon completion of phase 2 cardiac rehab.              Exercise Goals and Review:  Exercise Goals     Row Name 08/19/21 1454             Exercise Goals   Increase Physical Activity Yes       Intervention Provide advice, education, support and counseling about physical activity/exercise needs.;Develop an individualized exercise prescription for aerobic and resistive training based on initial evaluation findings, risk stratification, comorbidities and participant's personal goals.       Expected Outcomes Short Term: Attend rehab on a regular basis to increase amount of physical activity.;Long Term: Add in home exercise to make exercise part of routine and to increase amount of physical activity.;Long Term: Exercising regularly at least 3-5 days a week.       Increase Strength and Stamina Yes  Intervention Provide advice, education, support and counseling about physical activity/exercise needs.;Develop an individualized exercise prescription for aerobic and resistive training based on initial evaluation findings, risk stratification, comorbidities and participant's  personal goals.       Expected Outcomes Short Term: Increase workloads from initial exercise prescription for resistance, speed, and METs.;Short Term: Perform resistance training exercises routinely during rehab and add in resistance training at home;Long Term: Improve cardiorespiratory fitness, muscular endurance and strength as measured by increased METs and functional capacity (6MWT)       Able to understand and use rate of perceived exertion (RPE) scale Yes       Intervention Provide education and explanation on how to use RPE scale       Expected Outcomes Short Term: Able to use RPE daily in rehab to express subjective intensity level;Long Term:  Able to use RPE to guide intensity level when exercising independently       Knowledge and understanding of Target Heart Rate Range (THRR) Yes       Expected Outcomes Short Term: Able to state/look up THRR;Short Term: Able to use daily as guideline for intensity in rehab;Long Term: Able to use THRR to govern intensity when exercising independently       Understanding of Exercise Prescription Yes       Intervention Provide education, explanation, and written materials on patient's individual exercise prescription       Expected Outcomes Short Term: Able to explain program exercise prescription;Long Term: Able to explain home exercise prescription to exercise independently                Exercise Goals Re-Evaluation:  Exercise Goals Re-Evaluation     Row Name 08/23/21 1129 09/06/21 1107 09/13/21 1053 09/29/21 1100 10/08/21 1138     Exercise Goal Re-Evaluation   Exercise Goals Review Increase Physical Activity;Able to understand and use rate of perceived exertion (RPE) scale Increase Physical Activity;Able to understand and use rate of perceived exertion (RPE) scale;Increase Strength and Stamina;Knowledge and understanding of Target Heart Rate Range (THRR);Understanding of Exercise Prescription Increase Physical Activity;Able to understand and use  rate of perceived exertion (RPE) scale;Increase Strength and Stamina;Knowledge and understanding of Target Heart Rate Range (THRR);Understanding of Exercise Prescription;Able to check pulse independently Increase Physical Activity;Able to understand and use rate of perceived exertion (RPE) scale;Increase Strength and Stamina;Knowledge and understanding of Target Heart Rate Range (THRR);Understanding of Exercise Prescription;Able to check pulse independently Increase Physical Activity;Able to understand and use rate of perceived exertion (RPE) scale;Increase Strength and Stamina;Knowledge and understanding of Target Heart Rate Range (THRR);Understanding of Exercise Prescription;Able to check pulse independently   Comments Patient able to understand and use RPE scale appropriately. Reviewed exercise prescription with patient. Patient plans to ride his outdoor bike as his mode of home exercise. Discussed starting with 10-15 minutes and working up to 30 minutes of riding as tolerated since he hasn't ridden his bike in a few years, and patient is ameanble to this. Patient's goal is to know his parameters for exercise and to return to exercise in the [facility] [employer] program. Reviewed and practiced pulse counting with patient. Patient is working up to getting back to riding his 21 speed mountain bike. Patient plans to exercise at the gym 3 days/week and walk or ride his bike 2 days/week with a goal of exercising 5 days/week upon completion of the cardiac rehab program. Patient scheduled to complete the cardiac rehab program next week. Functional capacity increased 19% as measured by 6MWT, strength increased  8% as measured by grip strength test.   Expected Outcomes Increase workloads as tolerated to help increase cardiorespiratory fitness. Patient will add 1-2 days of exercise at home riding his bike in addition to exercise at cardiac rehab. Patient will be able to check pulse independently. Patient will continue to  exercise at least 30 minutes 5 days/week upon completion of the cardiac rehab program. Patient will continue to exercise at least 30 minutes 5 days/week upon completion of the cardiac rehab program.            Nutrition & Weight - Outcomes:  Pre Biometrics - 08/19/21 1315       Pre Biometrics   Waist Circumference 38.5 inches    Hip Circumference 40.5 inches    Waist to Hip Ratio 0.95 %    Triceps Skinfold 8 mm    % Body Fat 24.5 %    Grip Strength 50 kg    Flexibility 14.5 in    Single Leg Stand 30 seconds             Post Biometrics - 10/08/21 1034        Post  Biometrics   Waist Circumference 39.5 inches    Hip Circumference 39.75 inches    Waist to Hip Ratio 0.99 %    Triceps Skinfold 10 mm    Grip Strength 54 kg    Flexibility 11.63 in    Single Leg Stand 16.87 seconds             Nutrition:  Nutrition Therapy & Goals - 09/14/21 0746       Nutrition Therapy   Diet Heart Healthy Diet      Personal Nutrition Goals   Nutrition Goal Continue adherance to heart healthy diet to include lean protein/plant protein, fruits/vegetables, whole grains, nuts/seeds, low fat dairy.    Personal Goal #2 Continue to monitor sodium intake, aiming for <2330m per day.    Personal Goal #3 Patient to identify and limit food sources of saturated fat, trans fat, refined carbohydrates, and sodium.      Intervention Plan   Intervention Prescribe, educate and counsel regarding individualized specific dietary modifications aiming towards targeted core components such as weight, hypertension, lipid management, diabetes, heart failure and other comorbidities.    Expected Outcomes Short Term Goal: Understand basic principles of dietary content, such as calories, fat, sodium, cholesterol and nutrients.;Long Term Goal: Adherence to prescribed nutrition plan.             Nutrition Discharge:  Nutrition Assessments - 10/04/21 1607       Rate Your Plate Scores   Post Score 63       MEDFICTS Scores   Post Score --             Education Questionnaire Score:  Knowledge Questionnaire Score - 10/01/21 1658       Knowledge Questionnaire Score   Pre Score 20/24    Post Score 21/24             Goals reviewed with patient; copy given to patient.Pt graduated from cardiac rehab program on 10/15/21  with completion of  exercise sessions in Phase II. Pt maintained good attendance and progressed nicely during his participation in rehab as evidenced by increased MET level. Jais increased his distance on his post exercise walk test.  Medication list reconciled. Repeat  PHQ score-0  .  Pt has made significant lifestyle changes and should be commended for his success. Pt feels he has  achieved his goals during cardiac rehab.   Pt plans to continue exercise by walking riding his bike and going to the gym. We are proud of Deontrey's progress!Harrell Gave RN BSN

## 2021-10-22 ENCOUNTER — Other Ambulatory Visit: Payer: Self-pay | Admitting: Cardiology

## 2021-10-22 DIAGNOSIS — I2510 Atherosclerotic heart disease of native coronary artery without angina pectoris: Secondary | ICD-10-CM

## 2021-11-12 ENCOUNTER — Other Ambulatory Visit: Payer: Self-pay | Admitting: Cardiology

## 2021-11-12 DIAGNOSIS — I2510 Atherosclerotic heart disease of native coronary artery without angina pectoris: Secondary | ICD-10-CM

## 2021-11-25 ENCOUNTER — Ambulatory Visit: Payer: Medicare Other | Admitting: Cardiology

## 2021-11-25 ENCOUNTER — Encounter: Payer: Self-pay | Admitting: Cardiology

## 2021-11-25 VITALS — BP 145/78 | HR 57 | Temp 98.3°F | Resp 17 | Ht 70.5 in | Wt 206.0 lb

## 2021-11-25 DIAGNOSIS — E782 Mixed hyperlipidemia: Secondary | ICD-10-CM

## 2021-11-25 DIAGNOSIS — I2510 Atherosclerotic heart disease of native coronary artery without angina pectoris: Secondary | ICD-10-CM

## 2021-11-25 DIAGNOSIS — I10 Essential (primary) hypertension: Secondary | ICD-10-CM

## 2021-11-25 NOTE — Progress Notes (Signed)
Patient referred by [name], MD for shortness of breath  Subjective:   Alexander Burns, male    DOB: April 21, 1951, 71 y.o.   MRN: [id number]   Chief Complaint  Patient presents with   Coronary Artery Disease   Hypertension    3 month    HPI  71 y/o [place] American male with hypertension, mixed hyperlipidemia, family h/o CAD, s/p mid LAD PCI (05/2021) for unstable angina  Patient is doing well, denies chest pain, shortness of breath, palpitations, leg edema, orthopnea, PND, TIA/syncope. Blood pressure elevated today, but usually well controlled.He admits to not taking Crestor regularly.   He denies family h/o cardiomyopathy. He has one son who is 61 (as of 2023).   Current Outpatient Medications:    albuterol (VENTOLIN HFA) 108 (90 Base) MCG/ACT inhaler, Inhale 2 puffs into the lungs every 4 (four) hours as needed for wheezing or shortness of breath. (Patient not taking: Reported on 10/06/2021), Disp: 18 g, Rfl: 1   amLODipine (NORVASC) 5 MG tablet, Take 5 mg by mouth in the morning., Disp: , Rfl:    aspirin EC 81 MG tablet, Take 1 tablet (81 mg total) by mouth daily. Swallow whole., Disp: 90 tablet, Rfl: 3   Budeson-Glycopyrrol-Formoterol (BREZTRI AEROSPHERE) 160-9-4.8 MCG/ACT AERO, Inhale 2 puffs into the lungs in the morning and at bedtime. (Patient not taking: Reported on 10/06/2021), Disp: 11 g, Rfl: 5   cholecalciferol (VITAMIN D) 1000 UNITS tablet, Take 1,000 Units by mouth every morning. , Disp: , Rfl:    fluticasone (FLONASE) 50 MCG/ACT nasal spray, Place 1 spray into both nostrils 2 (two) times daily as needed for allergies or rhinitis., Disp: 16 g, Rfl: 5   pantoprazole (PROTONIX) 40 MG tablet, Take 40 mg by mouth daily., Disp: , Rfl:    Propylene Glycol 0.6 % SOLN, Place 1 drop into both eyes 4 (four) times daily as needed (dry/irritated eyes)., Disp: , Rfl:     Cardiovascular and other pertinent studies:  EKG 11/25/2021: Sinus rhythm 53 bpm Left atrial  enlargement.  LVH ST depression   +   Extensive T-abnormality  -Secondary to hypertrophy -possible  Anterolateral and inferior ischemia.    Echocardiogram 05/06/2021:  Normal LV systolic function with EF 61%. Left ventricle cavity is normal  in size. 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 Septum  measures 1.7 cm. Normal global wall motion. Doppler evidence of grade II  (pseudonormal) diastolic dysfunction, elevated LAP.  Left atrial cavity is mildly dilated.  Moderate (Grade II) mitral regurgitation.  Mild to moderate tricuspid regurgitation. Estimated pulmonary artery  systolic pressure 26 mmHg.   Coronary intervention 05/06/2021: LM: Normal LAD: Mid LAD 75-80% stenoses. RFR 0.89 Lcx: No significant disease RCA: No significant disease   Successful percutaneous coronary intervention mid LAD (non-overlapping stents)        PTCA and stent placement 3.5 X 20 mm Synergy drug-eluting stent, post dilatation with 3.75 mm [place]        PTCA and stent placement 4.0 X 16 mm Synergy drug-eluting stent, post dilatation with 4.0 mm [place]   Post PCI RFR 0.93  EKG 05/06/2021: Sinus rhythm 57 bpm  LVH Old anteroseptal infarct Anterolateral and inferior T wave inversion, possible ischemia   Recent labs: 05/07/2021: Glucose 107, BUN/Cr 14/0.8. EGFR >60. Na/K 140/3.9. Rest of the CMP normal H/H 14/43. MCV 91. Platelets 188 BNP 24  01/12/2021: Glucose 98, BUN/Cr 16/1.1. EGFR 80. Na/K 141/4.2. Rest of the CMP normal H/H 12.8/37.7. MCV 89.  Platelets 138 HbA1C N/A Chol 213, TG 80, HDL 49, LDL 148 TSH 0.9 normal   Review of Systems  Cardiovascular:  Negative for chest pain, dyspnea on exertion, leg swelling, palpitations and syncope.  Respiratory:  Negative for cough and shortness of breath.          Vitals:   11/25/21 1038  BP: (!) 145/78  Pulse: (!) 57  Resp: 17  Temp: 98.3 F (36.8 C)  SpO2: 99%      Body mass index is 28.07 kg/m. Filed Weights   11/25/21 1038   Weight: 206 lb (93.4 kg)     Objective:   Physical Exam Vitals reviewed.  Constitutional:      General: He is not in acute distress. Neck:     Vascular: No JVD.  Cardiovascular:     Rate and Rhythm: Normal rate and regular rhythm.     Heart sounds: Normal heart sounds. No murmur heard. Pulmonary:     Effort: Pulmonary effort is normal.     Breath sounds: Normal breath sounds. No wheezing or rales.  Musculoskeletal:     Right lower leg: No edema.     Left lower leg: No edema.      ICD-10-CM   1. Essential hypertension  I10 EKG 12-Lead    2. Mixed hyperlipidemia  E78.2 Lipid panel    Lipid panel    3. Coronary artery disease involving native coronary artery of native heart without angina pectoris  I25.10            Assessment & Recommendations:   71 y/o [place] American male with hypertension, mixed hyperlipidemia, family h/o CAD, s/p mid LAD PCI (05/2021) for unstable angina  CAD: S/p PCI to mid LAD 05/06/2021 Continue aspirin 81 mg daily.  Plavix stopped early due to intractable cough, that has since resolved.  Hypertension: Reasonably well controlled on amlodipine 10 mg daily.   Hyperlipidemia: Currently not taking statin.  LDL is 110. I have encouraged him to take it regularly. If he is not able to tolerate, we can consider Repatha. Repeat lipid panel in 1 month  LVH:  Echocardiogram noted moderate asymmetric hypertrophy without any LVOT obstruction.   No clinical symptoms s/o LVOT obstruction. He is reluctant to undergo MRI due to claustrophobia.   F/u in 3 months  [employer], PA-C 11/25/2021, 10:39 AM Office: [phone]

## 2021-12-03 ENCOUNTER — Other Ambulatory Visit: Payer: Self-pay | Admitting: Cardiology

## 2021-12-03 DIAGNOSIS — I2510 Atherosclerotic heart disease of native coronary artery without angina pectoris: Secondary | ICD-10-CM

## 2022-01-03 ENCOUNTER — Ambulatory Visit: Payer: Medicare Other | Admitting: Cardiology

## 2022-01-05 LAB — LIPID PANEL
Chol/HDL Ratio: 3.6 ratio (ref 0.0–5.0)
Cholesterol, Total: 206 mg/dL — ABNORMAL HIGH (ref 100–199)
HDL: 58 mg/dL (ref >39)
LDL Chol Calc (NIH): 135 mg/dL — ABNORMAL HIGH (ref 0–99)
Triglycerides: 74 mg/dL (ref 0–149)
VLDL Cholesterol Cal: 13 mg/dL (ref 5–40)

## 2022-01-25 NOTE — Progress Notes (Signed)
Bad cholesterol (LDL) remains very high at 132, has in fact increased since last check. Given that he has not tolerated statins, I strongly recommend Repatha once every two weeks. Please check with the patient and send the prescription is agreeable.  Thanks MJP

## 2022-01-26 NOTE — Progress Notes (Signed)
Called and spoke with patient regarding his recent lab results. Patient refuses Repatha injections and will try to restart the Atorvastatin.

## 2022-02-27 ENCOUNTER — Ambulatory Visit (HOSPITAL_COMMUNITY)
Admission: EM | Admit: 2022-02-27 | Discharge: 2022-02-27 | Disposition: A | Discharge status: Home / Self Care | Payer: No Typology Code available for payment source

## 2022-02-27 ENCOUNTER — Encounter (HOSPITAL_COMMUNITY): Payer: Self-pay

## 2022-02-27 DIAGNOSIS — M62838 Other muscle spasm: Secondary | ICD-10-CM

## 2022-02-27 NOTE — ED Provider Notes (Signed)
[facility]    CSN: [id number] Arrival date & time: 02/27/22  1250      History   Chief Complaint Chief Complaint  Patient presents with   Shoulder Pain    HPI Alexander Burns is a 71 y.o. male.   Pt complains of bilateral shoulder pain, neck pain that started about three weeks ago.  Denies injury or trauma.  Reports he had a massage last week with minimal relief.  He has been taking nothing for the pain.  Requesting a shot here today.     Past Medical History:  Diagnosis Date   Asthma    Bladder cancer ([place]) 2018   Coronary artery disease    Hypertension     Patient Active Problem List   Diagnosis Date Noted   Mixed hyperlipidemia 06/30/2021   Essential hypertension 06/29/2021   Coronary artery disease involving native coronary artery of native heart without angina pectoris    Unstable angina ([place]) 05/06/2021   Other allergic rhinitis 11/14/2017   Laryngopharyngeal reflux (LPR) 11/14/2017   Chronic diastolic CHF (congestive heart failure) ([place]) 05/07/2014   Shortness of breath 05/06/2014   Moderate persistent asthma 05/06/2014    Past Surgical History:  Procedure Laterality Date   BACK SURGERY     CARDIAC CATHETERIZATION     CORONARY STENT INTERVENTION N/A 05/07/2021   Procedure: CORONARY STENT INTERVENTION;  Surgeon: Nigel Mormon, MD;  Location: [facility];  Service: Cardiovascular;  Laterality: N/A;   EUS N/A 11/14/2019   Procedure: LOWER ENDOSCOPIC ULTRASOUND (EUS);  Surgeon: Milus Banister, MD;  Location: [facility];  Service: Endoscopy;  Laterality: N/A;   FLEXIBLE SIGMOIDOSCOPY N/A 11/14/2019   Procedure: FLEXIBLE SIGMOIDOSCOPY;  Surgeon: Milus Banister, MD;  Location: [facility];  Service: Endoscopy;  Laterality: N/A;   FOOT SURGERY     INTRAVASCULAR PRESSURE WIRE/FFR STUDY N/A 05/07/2021   Procedure: INTRAVASCULAR PRESSURE WIRE/FFR STUDY;  Surgeon: Nigel Mormon, MD;  Location: [facility];  Service:  Cardiovascular;  Laterality: N/A;   INTRAVASCULAR ULTRASOUND/IVUS N/A 05/07/2021   Procedure: Intravascular Ultrasound/IVUS;  Surgeon: Nigel Mormon, MD;  Location: [facility];  Service: Cardiovascular;  Laterality: N/A;   LEFT HEART CATH AND CORONARY ANGIOGRAPHY N/A 05/07/2021   Procedure: LEFT HEART CATH AND CORONARY ANGIOGRAPHY;  Surgeon: Nigel Mormon, MD;  Location: [facility];  Service: Cardiovascular;  Laterality: N/A;   SUBMUCOSAL TATTOO INJECTION  11/14/2019   Procedure: SUBMUCOSAL TATTOO INJECTION;  Surgeon: Milus Banister, MD;  Location: [facility];  Service: Endoscopy;;  mini probe ultrasound   TONSILLECTOMY         Home Medications    Prior to Admission medications   Medication Sig Start Date End Date Taking? Authorizing Provider  albuterol (VENTOLIN HFA) 108 (90 Base) MCG/ACT inhaler Inhale 2 puffs into the lungs every 4 (four) hours as needed for wheezing or shortness of breath. 06/22/21   Kozlow, Donnamarie Poag, MD  amLODipine (NORVASC) 5 MG tablet Take 5 mg by mouth in the morning.    [provider]  aspirin EC 81 MG tablet Take 1 tablet (81 mg total) by mouth daily. Swallow whole. 05/06/21   Patwardhan, Reynold Bowen, MD  Budeson-Glycopyrrol-Formoterol (BREZTRI AEROSPHERE) 160-9-4.8 MCG/ACT AERO Inhale 2 puffs into the lungs in the morning and at bedtime. 06/22/21   Kozlow, Donnamarie Poag, MD  cholecalciferol (VITAMIN D) 1000 UNITS tablet Take 1,000 Units by mouth every morning.     [provider]  fluticasone (FLONASE) 50 MCG/ACT nasal spray Place 1 spray into both nostrils as needed. 07/15/21   [provider]  pantoprazole (PROTONIX) 40 MG tablet Take 40 mg by mouth daily.    [provider]  Propylene Glycol 0.6 % SOLN Place 1 drop into both eyes 4 (four) times daily as needed (dry/irritated eyes). 06/14/21   [provider]    Family History Family History  Problem Relation Age of Onset   Diabetes Mother     Heart disease Father    Diabetes Father    Diabetes Sister    Breast cancer Sister    Heart attack Brother 59       first heart attack   Asthma Maternal Grandmother    Diabetes Other    Hypertension Other    CAD Other     Social History Social History   Tobacco Use   Smoking status: Former    Packs/day: 1.00    Years: 3.00    Total pack years: 3.00    Types: Cigarettes, Cigars    Quit date: 2000    Years since quitting: 23.7   Smokeless tobacco: Never  Vaping Use   Vaping Use: Never used  Substance Use Topics   Alcohol use: Yes    Comment: socially   Drug use: No     Allergies   Nabumetone   Review of Systems Review of Systems  Constitutional:  Negative for chills and fever.  HENT:  Negative for ear pain and sore throat.   Eyes:  Negative for pain and visual disturbance.  Respiratory:  Negative for cough and shortness of breath.   Cardiovascular:  Negative for chest pain and palpitations.  Gastrointestinal:  Negative for abdominal pain and vomiting.  Genitourinary:  Negative for dysuria and hematuria.  Musculoskeletal:  Positive for arthralgias (bilateral shoulder pain). Negative for back pain.  Skin:  Negative for color change and rash.  Neurological:  Negative for seizures and syncope.  All other systems reviewed and are negative.    Physical Exam Triage Vital Signs ED Triage Vitals [02/27/22 1336]  Enc Vitals Group     BP 137/62     Pulse Rate 80     Resp 18     Temp 98.1 F (36.7 C)     Temp Source Oral     SpO2 98 %     Weight      Height      Head Circumference      Peak Flow      Pain Score      Pain Loc      Pain Edu?      Excl. in [place]?    No data found.  Updated Vital Signs BP 137/62 (BP Location: Left Arm)   Pulse 80   Temp 98.1 F (36.7 C) (Oral)   Resp 18   SpO2 98%   Visual Acuity Right Eye Distance:   Left Eye Distance:   Bilateral Distance:    Right Eye Near:   Left Eye Near:    Bilateral Near:     Physical  Exam Vitals and nursing note reviewed.  Constitutional:      General: He is not in acute distress.    Appearance: He is well-developed.  HENT:     Head: Normocephalic and atraumatic.  Eyes:     Conjunctiva/sclera: Conjunctivae normal.  Cardiovascular:     Rate and Rhythm: Normal rate and regular rhythm.     Heart  sounds: No murmur heard. Pulmonary:     Effort: Pulmonary effort is normal. No respiratory distress.     Breath sounds: Normal breath sounds.  Abdominal:     Palpations: Abdomen is soft.     Tenderness: There is no abdominal tenderness.  Musculoskeletal:        General: No swelling.       Arms:     Cervical back: Neck supple.  Skin:    General: Skin is warm and dry.     Capillary Refill: Capillary refill takes less than 2 seconds.  Neurological:     Mental Status: He is alert.  Psychiatric:        Mood and Affect: Mood normal.      UC Treatments / Results  Labs (all labs ordered are listed, but only abnormal results are displayed) Labs Reviewed - No data to display  EKG   Radiology No results found.  Procedures Procedures (including critical care time)  Medications Ordered in UC Medications - No data to display  Initial Impression / Assessment and Plan / UC Course  I have reviewed the triage vital signs and the nursing notes.  Pertinent labs & imaging results that were available during my care of the patient were reviewed by me and considered in my medical decision making (see chart for details).     Cervical muscle spasm bilateral.  Pt requesting shot for pain today.  Toradol not advised given cardio history, pt reports he doesn't want toradol anyways. He does not want to try any pills either.  Advised massage, heat, tylenol.   Final Clinical Impressions(s) / UC Diagnoses   Final diagnoses:  Muscle spasm     Discharge Instructions      Follow up with PCP Recommend stretching, massage, tylenol      ED Prescriptions   None    PDMP  not reviewed this encounter.   Ward, Lenise Arena, PA-C 02/27/22 1356

## 2022-02-27 NOTE — ED Triage Notes (Signed)
Pt presents with bilateral shoulder pain x 3 weeks. Denies any recent injuries or falls.

## 2022-02-27 NOTE — Discharge Instructions (Addendum)
Follow up with PCP Recommend stretching, massage, tylenol

## 2022-02-28 ENCOUNTER — Encounter: Payer: Self-pay | Admitting: Internal Medicine

## 2022-02-28 ENCOUNTER — Ambulatory Visit: Payer: Medicare Other | Admitting: Internal Medicine

## 2022-02-28 VITALS — BP 142/77 | HR 53 | Temp 98.0°F | Resp 16 | Ht 70.0 in | Wt 199.0 lb

## 2022-02-28 DIAGNOSIS — E7800 Pure hypercholesterolemia, unspecified: Secondary | ICD-10-CM

## 2022-02-28 DIAGNOSIS — I2510 Atherosclerotic heart disease of native coronary artery without angina pectoris: Secondary | ICD-10-CM

## 2022-02-28 DIAGNOSIS — I10 Essential (primary) hypertension: Secondary | ICD-10-CM

## 2022-02-28 MED ORDER — ROSUVASTATIN CALCIUM 40 MG PO TABS: 40.0000 mg | ORAL_TABLET | Freq: Every day | ORAL | 3 refills | Status: DC

## 2022-02-28 NOTE — Progress Notes (Signed)
Patient referred by [name], MD for follow-up visit for CAD  Subjective:   Alexander Burns, male    DOB: 1950-09-26, 71 y.o.   MRN: [id number]   Chief Complaint  Patient presents with   Hypertension   Hyperlipidemia   Follow-up    3 month    Hypertension Pertinent negatives include no chest pain, palpitations or shortness of breath.  Hyperlipidemia Pertinent negatives include no chest pain or shortness of breath.    71 y/o [place] American male with hypertension, mixed hyperlipidemia, family h/o CAD, s/p mid LAD PCI (05/2021) for unstable angina  Patient is doing well, denies chest pain, shortness of breath, palpitations, leg edema, orthopnea, PND, TIA/syncope. Blood pressure elevated today, but usually well controlled.He admits to not taking statin regularly. He is willing to try Crestor now. His LDL last month was elevated as well. Discussed importance of taking statin daily with patient given his CAD.  He denies family h/o cardiomyopathy. He has one son who is 46 (as of 2023).   Current Outpatient Medications:    amLODipine (NORVASC) 5 MG tablet, Take 5 mg by mouth in the morning., Disp: , Rfl:    aspirin EC 81 MG tablet, Take 1 tablet (81 mg total) by mouth daily. Swallow whole., Disp: 90 tablet, Rfl: 3   cholecalciferol (VITAMIN D) 1000 UNITS tablet, Take 1,000 Units by mouth every morning. , Disp: , Rfl:    fluticasone (FLONASE) 50 MCG/ACT nasal spray, Place 1 spray into both nostrils as needed., Disp: , Rfl:    pantoprazole (PROTONIX) 40 MG tablet, Take 40 mg by mouth daily., Disp: , Rfl:    Propylene Glycol 0.6 % SOLN, Place 1 drop into both eyes 4 (four) times daily as needed (dry/irritated eyes)., Disp: , Rfl:    rosuvastatin (CRESTOR) 40 MG tablet, Take 1 tablet (40 mg total) by mouth at bedtime., Disp: 90 tablet, Rfl: 3    Cardiovascular and other pertinent studies:  EKG 11/25/2021: Sinus rhythm 53 bpm Left atrial enlargement.  LVH ST depression    +   Extensive T-abnormality  -Secondary to hypertrophy -possible  Anterolateral and inferior ischemia.    Echocardiogram 05/06/2021:  Normal LV systolic function with EF 61%. Left ventricle cavity is normal  in size. 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 Septum  measures 1.7 cm. Normal global wall motion. Doppler evidence of grade II  (pseudonormal) diastolic dysfunction, elevated LAP.  Left atrial cavity is mildly dilated.  Moderate (Grade II) mitral regurgitation.  Mild to moderate tricuspid regurgitation. Estimated pulmonary artery  systolic pressure 26 mmHg.   Coronary intervention 05/06/2021: LM: Normal LAD: Mid LAD 75-80% stenoses. RFR 0.89 Lcx: No significant disease RCA: No significant disease   Successful percutaneous coronary intervention mid LAD (non-overlapping stents)        PTCA and stent placement 3.5 X 20 mm Synergy drug-eluting stent, post dilatation with 3.75 mm Bylas        PTCA and stent placement 4.0 X 16 mm Synergy drug-eluting stent, post dilatation with 4.0 mm Harvard   Post PCI RFR 0.93  EKG 05/06/2021: Sinus rhythm 57 bpm  LVH Old anteroseptal infarct Anterolateral and inferior T wave inversion, possible ischemia   Recent labs: 05/07/2021: Glucose 107, BUN/Cr 14/0.8. EGFR >60. Na/K 140/3.9. Rest of the CMP normal H/H 14/43. MCV 91. Platelets 188 BNP 24  01/12/2021: Glucose 98, BUN/Cr 16/1.1. EGFR 80. Na/K 141/4.2. Rest of the CMP normal H/H 12.8/37.7. MCV 89. Platelets 138 HbA1C N/A Chol  213, TG 80, HDL 49, LDL 148 TSH 0.9 normal   Review of Systems  Cardiovascular:  Negative for chest pain, dyspnea on exertion, leg swelling, palpitations and syncope.  Respiratory:  Negative for cough and shortness of breath.          Vitals:   02/28/22 1058  BP: (!) 142/77  Pulse: (!) 53  Resp: 16  Temp: 98 F (36.7 C)  SpO2: 97%      Body mass index is 28.55 kg/m. Filed Weights   02/28/22 1058  Weight: 199 lb (90.3 kg)      Objective:   Physical Exam Vitals reviewed.  Constitutional:      General: He is not in acute distress. Neck:     Vascular: No JVD.  Cardiovascular:     Rate and Rhythm: Normal rate and regular rhythm.     Heart sounds: Normal heart sounds. No murmur heard. Pulmonary:     Effort: Pulmonary effort is normal.     Breath sounds: Normal breath sounds. No wheezing or rales.  Musculoskeletal:     Right lower leg: No edema.     Left lower leg: No edema.      ICD-10-CM   1. Coronary artery disease involving native coronary artery of native heart without angina pectoris  I25.10     2. Hypercholesterolemia  E78.00     3. Essential hypertension  I10            Assessment & Recommendations:   71 y/o [place] American male with hypertension, mixed hyperlipidemia, family h/o CAD, s/p mid LAD PCI (05/2021) for unstable angina  CAD: S/p PCI to mid LAD 05/06/2021 Continue aspirin 81 mg daily.  Plavix stopped early due to intractable cough, that has since resolved.  Hypertension: Reasonably well controlled on amlodipine 10 mg daily.  He states he always runs high at the doctors office At home, he took his BP this morning and it was 128/68  Hyperlipidemia: Currently not regularly taking statin.  LDL is 135 (previously 110). I have encouraged him to take it regularly.  Patient agreeable to trying Crestor $RemoveBefore'40mg'[id number]$  qHS Repeat lipid panel in 1-2 month  LVH:  Echocardiogram noted moderate asymmetric hypertrophy without any LVOT obstruction.   No clinical symptoms s/o LVOT obstruction.   F/u in 6 months  Floydene Flock, DO, [facility] 02/28/2022, 11:24 AM Office: [phone]

## 2022-03-03 ENCOUNTER — Ambulatory Visit: Payer: Medicare Other | Admitting: Cardiology

## 2022-03-04 ENCOUNTER — Ambulatory Visit: Payer: Medicare Other | Admitting: Cardiology

## 2022-04-26 ENCOUNTER — Other Ambulatory Visit: Payer: Self-pay | Admitting: Cardiology

## 2022-06-17 ENCOUNTER — Other Ambulatory Visit: Payer: Self-pay | Admitting: Cardiology

## 2022-08-08 ENCOUNTER — Ambulatory Visit (HOSPITAL_COMMUNITY)
Admission: EM | Admit: 2022-08-08 | Discharge: 2022-08-08 | Disposition: A | Discharge status: Home / Self Care | Payer: Medicare Other | Attending: Family | Admitting: Family

## 2022-08-08 ENCOUNTER — Encounter (HOSPITAL_COMMUNITY): Payer: Self-pay

## 2022-08-08 DIAGNOSIS — J0100 Acute maxillary sinusitis, unspecified: Secondary | ICD-10-CM

## 2022-08-08 DIAGNOSIS — R051 Acute cough: Secondary | ICD-10-CM

## 2022-08-08 MED ORDER — AMOXICILLIN-POT CLAVULANATE 875-125 MG PO TABS
1.0000 | ORAL_TABLET | Freq: Two times a day (BID) | ORAL | 0 refills | Status: AC
Start: 2022-08-08 — End: 2022-08-15

## 2022-08-08 NOTE — Discharge Instructions (Addendum)
Recommend start Augmentin '875mg'$  twice a day for 7 days. Continue to push fluids to help loosen up mucus in sinuses and chest. May take OTC Delsym 2 teaspoons every 12 hours as needed for cough. May continue Albuterol inhaler 2 puffs every 6 hours as needed. Follow-up in 3 to 4 days if not improving or sooner if worsening.

## 2022-08-08 NOTE — ED Provider Notes (Signed)
Alexander Burns    CSN: [id number] Arrival date & time: 08/08/22  1016      History   Chief Complaint Chief Complaint  Patient presents with   Nasal Congestion    HPI Alexander Burns is a 72 y.o. male.   72 year old male presents with nasal congestion and cough for the past 5 to 6 days. 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 Then started having nasal congestion and sinus pressure. Started using Flonase with slight success. Then was improving with less chest congestion until yesterday when experiencing more sinus pressure and congestion. Denies any fever or GI symptoms. Has tried using Flonase and Nyquil with minimal relief. No other family members ill. Also has history of asthma and has used Albuterol inhaler with some relief in coughing. Former smoker. Other chronic health issues include HTN which is controlled with Norvasc '5mg'$  daily and hyperlipidemia which is controlled with Crestor '40mg'$  daily.  The history is provided by the patient.    Past Medical History:  Diagnosis Date   Asthma    Bladder cancer ([place]) 2018   Coronary artery disease    Hypertension     Patient Active Problem List   Diagnosis Date Noted   Hypercholesterolemia 02/28/2022   Mixed hyperlipidemia 06/30/2021   Essential hypertension 06/29/2021   Coronary artery disease involving native coronary artery of native heart without angina pectoris    Unstable angina ([place]) 05/06/2021   Other allergic rhinitis 11/14/2017   Laryngopharyngeal reflux (LPR) 11/14/2017   Chronic diastolic CHF (congestive heart failure) ([place]) 05/07/2014   Shortness of breath 05/06/2014   Moderate persistent asthma 05/06/2014    Past Surgical History:  Procedure Laterality Date   BACK SURGERY     CARDIAC CATHETERIZATION     CORONARY STENT INTERVENTION N/A 05/07/2021   Procedure: CORONARY STENT INTERVENTION;   Surgeon: Nigel Mormon, MD;  Location: [facility];  Service: Cardiovascular;  Laterality: N/A;   EUS N/A 11/14/2019   Procedure: LOWER ENDOSCOPIC ULTRASOUND (EUS);  Surgeon: Milus Banister, MD;  Location: [facility];  Service: Endoscopy;  Laterality: N/A;   FLEXIBLE SIGMOIDOSCOPY N/A 11/14/2019   Procedure: FLEXIBLE SIGMOIDOSCOPY;  Surgeon: Milus Banister, MD;  Location: [facility];  Service: Endoscopy;  Laterality: N/A;   FOOT SURGERY     INTRAVASCULAR PRESSURE WIRE/FFR STUDY N/A 05/07/2021   Procedure: INTRAVASCULAR PRESSURE WIRE/FFR STUDY;  Surgeon: Nigel Mormon, MD;  Location: [facility] [place] [facility];  Service: Cardiovascular;  Laterality: N/A;   INTRAVASCULAR ULTRASOUND/IVUS N/A 05/07/2021   Procedure: Intravascular Ultrasound/IVUS;  Surgeon: Nigel Mormon, MD;  Location: [place] [facility];  Service: Cardiovascular;  Laterality: N/A;   LEFT HEART CATH AND CORONARY ANGIOGRAPHY N/A 05/07/2021   Procedure: LEFT HEART CATH AND CORONARY ANGIOGRAPHY;  Surgeon: Nigel Mormon, MD;  Location: [place];  Service: Cardiovascular;  Laterality: N/A;   SUBMUCOSAL TATTOO INJECTION  11/14/2019   Procedure: SUBMUCOSAL TATTOO INJECTION;  Surgeon: Milus Banister, MD;  Location: [facility];  Service: Endoscopy;;  mini probe ultrasound   TONSILLECTOMY         Home Medications    Prior to Admission medications   Medication Sig Start Date End Date Taking? Authorizing Provider  albuterol (VENTOLIN HFA) 108 (90 Base) MCG/ACT inhaler Inhale 2 puffs into the lungs every 6 (six) hours as needed for wheezing or shortness  of breath.   Yes [provider]  amoxicillin-clavulanate (AUGMENTIN) 875-125 MG tablet Take 1 tablet by mouth every 12 (twelve) hours for 7 days. 08/08/22 08/15/22 Yes Kaely Hollan, Nicholes Stairs, NP  amLODipine (NORVASC) 5 MG tablet TAKE 1 TABLET (5 MG TOTAL) BY MOUTH DAILY. 06/17/22   Custovic, Collene Mares, DO  aspirin EC 81 MG tablet TAKE 1 TABLET  (81 MG TOTAL) BY MOUTH DAILY. SWALLOW WHOLE. 04/26/22   Patwardhan, Reynold Bowen, MD  cholecalciferol (VITAMIN D) 1000 UNITS tablet Take 1,000 Units by mouth every morning.     [provider]  fluticasone (FLONASE) 50 MCG/ACT nasal spray Place 1 spray into both nostrils as needed. 07/15/21   [provider]  pantoprazole (PROTONIX) 40 MG tablet Take 40 mg by mouth daily.    [provider]  rosuvastatin (CRESTOR) 40 MG tablet Take 1 tablet (40 mg total) by mouth at bedtime. 02/28/22 05/29/22  Custovic, Collene Mares, DO    Family History Family History  Problem Relation Age of Onset   Diabetes Mother    Heart disease Father    Diabetes Father    Diabetes Sister    Breast cancer Sister    Heart attack Brother 58       first heart attack   Asthma Maternal Grandmother    Diabetes Other    Hypertension Other    CAD Other     Social History Social History   Tobacco Use   Smoking status: Former    Packs/day: 1.00    Years: 3.00    Total pack years: 3.00    Types: Cigarettes, Cigars    Quit date: 2000    Years since quitting: 24.1   Smokeless tobacco: Never  Vaping Use   Vaping Use: Never used  Substance Use Topics   Alcohol use: Yes    Comment: socially   Drug use: No     Allergies   Nabumetone   Review of Systems Review of Systems  Constitutional:  Positive for activity change and fatigue. Negative for diaphoresis and fever.  HENT:  Positive for congestion, postnasal drip, sinus pressure, sinus pain and sore throat (irritated). Negative for ear discharge, ear pain, facial swelling, mouth sores and trouble swallowing.   Eyes:  Negative for pain and itching.  Respiratory:  Positive for cough. Negative for chest tightness, shortness of breath and wheezing.   Gastrointestinal:  Negative for diarrhea, nausea and vomiting.  Musculoskeletal:  Negative for neck pain and neck stiffness.  Skin:  Negative for color change and rash.  Allergic/Immunologic:  Positive for environmental allergies. Negative for food allergies.  Neurological:  Negative for dizziness, tremors, seizures, syncope, speech difficulty, numbness and headaches.  Hematological:  Negative for adenopathy. Does not bruise/bleed easily.     Physical Exam Triage Vital Signs ED Triage Vitals  Enc Vitals Group     BP 08/08/22 1120 (!) 159/76     Pulse Rate 08/08/22 1120 70     Resp 08/08/22 1120 16     Temp 08/08/22 1120 98.7 F (37.1 C)     Temp Source 08/08/22 1120 Oral     SpO2 08/08/22 1120 95 %     Weight --      Height --      Head Circumference --      Peak Flow --      Pain Score 08/08/22 1123 0     Pain Loc --      Pain Edu? --      Excl.  in [place]? --    No data found.  Updated Vital Signs BP (!) 159/76 (BP Location: Left Arm)   Pulse 70   Temp 98.7 F (37.1 C) (Oral)   Resp 16   SpO2 95%   Visual Acuity Right Eye Distance:   Left Eye Distance:   Bilateral Distance:    Right Eye Near:   Left Eye Near:    Bilateral Near:     Physical Exam Vitals and nursing note reviewed.  Constitutional:      General: He is awake. He is not in acute distress.    Appearance: He is well-developed and well-groomed.     Comments: He is sitting on the exam table in no acute distress but appears tired.   HENT:     Head: Normocephalic and atraumatic.     Right Ear: Hearing, ear canal and external ear normal. A middle ear effusion is present. Tympanic membrane is bulging. Tympanic membrane is not injected or erythematous.     Left Ear: Hearing, tympanic membrane, ear canal and external ear normal.  No middle ear effusion. Tympanic membrane is not injected, erythematous or bulging.     Nose: Congestion present.     Right Sinus: Maxillary sinus tenderness present. No frontal sinus tenderness.     Left Sinus: Maxillary sinus tenderness present. No frontal sinus tenderness.     Mouth/Throat:     Lips: Pink.     Mouth: Mucous membranes are moist.     Pharynx: Uvula  midline. Posterior oropharyngeal erythema present. No pharyngeal swelling, oropharyngeal exudate or uvula swelling.     Comments: Yellow post nasal drainage present Eyes:     Extraocular Movements: Extraocular movements intact.     Conjunctiva/sclera: Conjunctivae normal.  Cardiovascular:     Rate and Rhythm: Normal rate and regular rhythm.     Heart sounds: Normal heart sounds. No murmur heard. Pulmonary:     Effort: Pulmonary effort is normal. No respiratory distress.     Breath sounds: Normal air entry. No decreased air movement. Examination of the right-upper field reveals wheezing and rhonchi. Examination of the left-upper field reveals wheezing and rhonchi. Wheezing and rhonchi present. No decreased breath sounds.     Comments: Wheezes and mild rhonchi in upper lobes, mainly with coughing.  Musculoskeletal:     Cervical back: Normal range of motion and neck supple.  Lymphadenopathy:     Cervical: Cervical adenopathy present.     Right cervical: Superficial cervical adenopathy present.     Left cervical: Superficial cervical adenopathy present.  Skin:    General: Skin is warm and dry.     Capillary Refill: Capillary refill takes less than 2 seconds.     Findings: No rash.  Neurological:     General: No focal deficit present.     Mental Status: He is alert and oriented to person, place, and time.  Psychiatric:        Mood and Affect: Mood normal.        Behavior: Behavior normal. Behavior is cooperative.        Thought Content: Thought content normal.        Judgment: Judgment normal.      UC Treatments / Results  Labs (all labs ordered are listed, but only abnormal results are displayed) Labs Reviewed - No data to display  EKG   Radiology No results found.  Procedures Procedures (including critical care time)  Medications Ordered in UC Medications - No data to display  Initial Impression / Assessment and Plan / UC Course  I have reviewed the triage vital signs  and the nursing notes.  Pertinent labs & imaging results that were available during my care of the patient were reviewed by me and considered in my medical decision making (see chart for details).     Reviewed with patient that he probably has a sinus infection- may be viral but since symptoms were improving but now worsening within the past 24 hours, will treat for possible bacterial etiology. Will start Augmentin '875mg'$  twice a day as directed- take with food. Continue to push fluids to help loosen up mucus in sinuses and chest. May take OTC Delsym 2 teaspoons every 12 hours as needed for cough. Continue Albuterol inhaler 2 puffs every 6 hours as needed for cough or wheezing. Rest. Follow-up in 3 to 4 days if not improving or sooner if worsening.  Final Clinical Impressions(s) / UC Diagnoses   Final diagnoses:  Acute non-recurrent maxillary sinusitis  Acute cough     Discharge Instructions      Recommend start Augmentin '875mg'$  twice a day for 7 days. Continue to push fluids to help loosen up mucus in sinuses and chest. May take OTC Delsym 2 teaspoons every 12 hours as needed for cough. May continue Albuterol inhaler 2 puffs every 6 hours as needed. Follow-up in 3 to 4 days if not improving or sooner if worsening.     ED Prescriptions     Medication Sig Dispense Auth. Provider   amoxicillin-clavulanate (AUGMENTIN) 875-125 MG tablet Take 1 tablet by mouth every 12 (twelve) hours for 7 days. 14 tablet Tykira Wachs, Nicholes Stairs, NP      PDMP not reviewed this encounter.   Katy Apo, NP 08/08/22 1306

## 2022-08-08 NOTE — ED Triage Notes (Signed)
Patient c/o nasal congestion and a productive cough with clear sputum x 5 days.  Patient states he has been using Flonase, Nyquil, and Guaifenesin.

## 2022-08-22 ENCOUNTER — Encounter (HOSPITAL_COMMUNITY): Payer: Self-pay

## 2022-08-22 ENCOUNTER — Ambulatory Visit (HOSPITAL_COMMUNITY)
Admission: EM | Admit: 2022-08-22 | Discharge: 2022-08-22 | Disposition: A | Discharge status: Home / Self Care | Payer: Medicare Other | Attending: Urgent Care | Admitting: Urgent Care

## 2022-08-22 DIAGNOSIS — J209 Acute bronchitis, unspecified: Secondary | ICD-10-CM | POA: Diagnosis not present

## 2022-08-22 DIAGNOSIS — J0100 Acute maxillary sinusitis, unspecified: Secondary | ICD-10-CM | POA: Diagnosis not present

## 2022-08-22 DIAGNOSIS — H6593 Unspecified nonsuppurative otitis media, bilateral: Secondary | ICD-10-CM | POA: Diagnosis not present

## 2022-08-22 MED ORDER — DOXYCYCLINE HYCLATE 100 MG PO CAPS: 100.0000 mg | ORAL_CAPSULE | Freq: Two times a day (BID) | ORAL | 0 refills | Status: DC

## 2022-08-22 MED ORDER — PREDNISONE 10 MG (21) PO TBPK: ORAL_TABLET | Freq: Every day | ORAL | 0 refills | Status: DC

## 2022-08-22 MED ORDER — MONTELUKAST SODIUM 10 MG PO TABS: 10.0000 mg | ORAL_TABLET | Freq: Every day | ORAL | 0 refills | Status: DC

## 2022-08-22 NOTE — ED Triage Notes (Signed)
Pt is here for cough, nasal congestion x 3wks, pt states bilateral ears has been popping and aching x 1days

## 2022-08-22 NOTE — ED Provider Notes (Signed)
Doran    CSN: [id number] Arrival date & time: 08/22/22  [id number]      History   Chief Complaint Chief Complaint  Patient presents with   Otalgia   Cough    HPI Alexander Burns is a 72 y.o. male.   Pleasant 72 year old male presents today with nasal congestion and postnasal drainage including sinus pressure for the past 3+ weeks.  He saw his PCP at the beginning of the month and was prescribed Augmentin 875 mg twice daily for 1 week.  He states he has gone through 2-3 bottles of over-the-counter cough syrup.  He is also been taking Mucinex.  He feels like his cough has not improved and neither has his sinus drainage.  This past weekend, he started developing ear pressure, and popping noises primarily on the right side.  He denies any fever. He denies any SOB, CP or palpitations.   Otalgia Associated symptoms: cough   Cough Associated symptoms: ear pain     Past Medical History:  Diagnosis Date   Asthma    Bladder cancer ([place]) 2018   Coronary artery disease    Hypertension     Patient Active Problem List   Diagnosis Date Noted   Hypercholesterolemia 02/28/2022   Mixed hyperlipidemia 06/30/2021   Essential hypertension 06/29/2021   Coronary artery disease involving native coronary artery of native heart without angina pectoris    Unstable angina ([place]) 05/06/2021   Other allergic rhinitis 11/14/2017   Laryngopharyngeal reflux (LPR) 11/14/2017   Chronic diastolic CHF (congestive heart failure) ([place]) 05/07/2014   Shortness of breath 05/06/2014   Moderate persistent asthma 05/06/2014    Past Surgical History:  Procedure Laterality Date   BACK SURGERY     CARDIAC CATHETERIZATION     CORONARY STENT INTERVENTION N/A 05/07/2021   Procedure: CORONARY STENT INTERVENTION;  Surgeon: Nigel Mormon, MD;  Location: [facility];  Service: Cardiovascular;  Laterality: N/A;   EUS N/A 11/14/2019   Procedure: LOWER ENDOSCOPIC ULTRASOUND (EUS);  Surgeon:  Milus Banister, MD;  Location: [facility];  Service: Endoscopy;  Laterality: N/A;   FLEXIBLE SIGMOIDOSCOPY N/A 11/14/2019   Procedure: FLEXIBLE SIGMOIDOSCOPY;  Surgeon: Milus Banister, MD;  Location: [facility];  Service: Endoscopy;  Laterality: N/A;   FOOT SURGERY     INTRAVASCULAR PRESSURE WIRE/FFR STUDY N/A 05/07/2021   Procedure: INTRAVASCULAR PRESSURE WIRE/FFR STUDY;  Surgeon: Nigel Mormon, MD;  Location: [facility];  Service: Cardiovascular;  Laterality: N/A;   INTRAVASCULAR ULTRASOUND/IVUS N/A 05/07/2021   Procedure: Intravascular Ultrasound/IVUS;  Surgeon: Nigel Mormon, MD;  Location: [facility];  Service: Cardiovascular;  Laterality: N/A;   LEFT HEART CATH AND CORONARY ANGIOGRAPHY N/A 05/07/2021   Procedure: LEFT HEART CATH AND CORONARY ANGIOGRAPHY;  Surgeon: Nigel Mormon, MD;  Location: [facility];  Service: Cardiovascular;  Laterality: N/A;   SUBMUCOSAL TATTOO INJECTION  11/14/2019   Procedure: SUBMUCOSAL TATTOO INJECTION;  Surgeon: Milus Banister, MD;  Location: [facility];  Service: Endoscopy;;  mini probe ultrasound   TONSILLECTOMY         Home Medications    Prior to Admission medications   Medication Sig Start Date End Date Taking? Authorizing Provider  albuterol (VENTOLIN HFA) 108 (90 Base) MCG/ACT inhaler Inhale 2 puffs into the lungs every 6 (six) hours as needed for wheezing or shortness of breath.   Yes [provider]  amLODipine (NORVASC) 5 MG tablet TAKE 1 TABLET (5 MG  TOTAL) BY MOUTH DAILY. 06/17/22  Yes Custovic, Collene Mares, DO  aspirin EC 81 MG tablet TAKE 1 TABLET (81 MG TOTAL) BY MOUTH DAILY. SWALLOW WHOLE. 04/26/22  Yes Patwardhan, Manish J, MD  cholecalciferol (VITAMIN D) 1000 UNITS tablet Take 1,000 Units by mouth every morning.    Yes [provider]  doxycycline (VIBRAMYCIN) 100 MG capsule Take 1 capsule (100 mg total) by mouth 2 (two) times daily. 08/22/22  Yes Jazzalyn Loewenstein L, PA   fluticasone (FLONASE) 50 MCG/ACT nasal spray Place 1 spray into both nostrils as needed. 07/15/21  Yes [provider]  montelukast (SINGULAIR) 10 MG tablet Take 1 tablet (10 mg total) by mouth at bedtime. 08/22/22  Yes Senie Lanese L, PA  pantoprazole (PROTONIX) 40 MG tablet Take 40 mg by mouth daily.   Yes [provider]  predniSONE (STERAPRED UNI-PAK 21 TAB) 10 MG (21) TBPK tablet Take by mouth daily. Take 6 tabs by mouth daily  for 1 days, then 5 tabs for 1 days, then 4 tabs for 1 days, then 3 tabs for 1 days, 2 tabs for 1 days, then 1 tab by mouth daily for 1 days 08/22/22  Yes Doreene Forrey L, PA  rosuvastatin (CRESTOR) 40 MG tablet Take 1 tablet (40 mg total) by mouth at bedtime. 02/28/22 05/29/22  Custovic, Collene Mares, DO    Family History Family History  Problem Relation Age of Onset   Diabetes Mother    Heart disease Father    Diabetes Father    Diabetes Sister    Breast cancer Sister    Heart attack Brother 35       first heart attack   Asthma Maternal Grandmother    Diabetes Other    Hypertension Other    CAD Other     Social History Social History   Tobacco Use   Smoking status: Former    Packs/day: 1.00    Years: 3.00    Additional pack years: 0.00    Total pack years: 3.00    Types: Cigarettes, Cigars    Quit date: 2000    Years since quitting: 24.2   Smokeless tobacco: Never  Vaping Use   Vaping Use: Never used  Substance Use Topics   Alcohol use: Yes    Comment: socially   Drug use: No     Allergies   Nabumetone   Review of Systems Review of Systems  HENT:  Positive for ear pain.   Respiratory:  Positive for cough.   As per HPI   Physical Exam Triage Vital Signs ED Triage Vitals  Enc Vitals Group     BP 08/22/22 1026 (!) 156/77     Pulse Rate 08/22/22 1026 60     Resp 08/22/22 1026 16     Temp 08/22/22 1026 98 F (36.7 C)     Temp Source 08/22/22 1026 Oral     SpO2 08/22/22 1026 96 %     Weight --      Height --       Head Circumference --      Peak Flow --      Pain Score 08/22/22 1024 4     Pain Loc --      Pain Edu? --      Excl. in [place]? --    No data found.  Updated Vital Signs BP (!) 156/77 (BP Location: Right Arm)   Pulse 60   Temp 98 F (36.7 C) (Oral)   Resp 16  SpO2 96%   Visual Acuity Right Eye Distance:   Left Eye Distance:   Bilateral Distance:    Right Eye Near:   Left Eye Near:    Bilateral Near:     Physical Exam Vitals and nursing note reviewed.  Constitutional:      General: He is not in acute distress.    Appearance: Normal appearance. He is normal weight. He is not ill-appearing, toxic-appearing or diaphoretic.  HENT:     Head: Normocephalic and atraumatic.     Jaw: No swelling.     Salivary Glands: Right salivary gland is not diffusely enlarged or tender. Left salivary gland is not diffusely enlarged or tender.     Right Ear: Ear canal and external ear normal. A middle ear effusion is present. There is no impacted cerumen.     Left Ear: Ear canal and external ear normal. A middle ear effusion is present. There is no impacted cerumen.     Nose: Congestion and rhinorrhea present.     Right Turbinates: Enlarged and swollen.     Left Turbinates: Enlarged and swollen.     Right Sinus: Maxillary sinus tenderness present.     Left Sinus: Maxillary sinus tenderness present.     Mouth/Throat:     Mouth: Mucous membranes are moist. No oral lesions.     Tongue: No lesions.     Pharynx: Oropharynx is clear. Uvula midline. No pharyngeal swelling, oropharyngeal exudate, posterior oropharyngeal erythema or uvula swelling.     Tonsils: No tonsillar exudate or tonsillar abscesses.  Eyes:     General: No scleral icterus.       Right eye: No discharge.        Left eye: No discharge.     Extraocular Movements: Extraocular movements intact.     Conjunctiva/sclera: Conjunctivae normal.     Pupils: Pupils are equal, round, and reactive to light.  Cardiovascular:     Rate and  Rhythm: Normal rate and regular rhythm.  Pulmonary:     Effort: Pulmonary effort is normal. No respiratory distress.     Breath sounds: No stridor. Wheezing present. No rhonchi or rales.  Musculoskeletal:     Cervical back: Normal range of motion. No rigidity or tenderness.  Lymphadenopathy:     Cervical: No cervical adenopathy.  Skin:    General: Skin is warm.     Capillary Refill: Capillary refill takes less than 2 seconds.     Findings: No erythema or rash.  Neurological:     General: No focal deficit present.     Mental Status: He is alert and oriented to person, place, and time.      UC Treatments / Results  Labs (all labs ordered are listed, but only abnormal results are displayed) Labs Reviewed - No data to display  EKG   Radiology No results found.  Procedures Procedures (including critical care time)  Medications Ordered in UC Medications - No data to display  Initial Impression / Assessment and Plan / UC Course  I have reviewed the triage vital signs and the nursing notes.  Pertinent labs & imaging results that were available during my care of the patient were reviewed by me and considered in my medical decision making (see chart for details).     Acute maxillary sinusitis -patient already completed 7 days of Augmentin without efficacy.  Will therefore switch to doxycycline twice daily x 10 days.  I encouraged patient to continue his Flonase use. Otitis media with effusion -  clear fluid noted bilaterally.  Likely secondary to sinus infection.  Flonase thus far has been ineffective.  Will therefore add p.o. prednisone. Acute bronchitis -doxycycline and prednisone prescribed for the above-mentioned conditions.  Will also assist in his bronchitis.  Offered inhaler, he states he has 1 at home.  Recommended he take this 2 puffs every 4-6 hours as needed.   Final Clinical Impressions(s) / UC Diagnoses   Final diagnoses:  Acute non-recurrent maxillary sinusitis   Otitis media with effusion, bilateral  Acute bronchitis, unspecified organism     Discharge Instructions      Please start taking doxycycline twice daily until gone, for a total of 10 days. Drink plenty of water while on this antibiotic. Please also take the prednisone taper pack as directed per package instructions. Continue plain Mucinex with plenty of water, and your Flonase. I would also recommend you purchase the NeilMed sinus rinse, and rinse your sinus passages to remove any causative agents such as pollen or mold spores.  Please use your albuterol inhaler 2 puffs every 4-6 hours as needed. I have called in montelukast, a tablet to take nightly for the next 10 days to help with the cough. Follow up with your PCP if your symptoms persist upon completion of the medications.     ED Prescriptions     Medication Sig Dispense Auth. Provider   doxycycline (VIBRAMYCIN) 100 MG capsule Take 1 capsule (100 mg total) by mouth 2 (two) times daily. 20 capsule Valori Hollenkamp L, PA   predniSONE (STERAPRED UNI-PAK 21 TAB) 10 MG (21) TBPK tablet Take by mouth daily. Take 6 tabs by mouth daily  for 1 days, then 5 tabs for 1 days, then 4 tabs for 1 days, then 3 tabs for 1 days, 2 tabs for 1 days, then 1 tab by mouth daily for 1 days 21 tablet Rhina Kramme L, PA   montelukast (SINGULAIR) 10 MG tablet Take 1 tablet (10 mg total) by mouth at bedtime. 10 tablet Nylee Barbuto L, [place]      PDMP not reviewed this encounter.   Chaney Malling, [place] 08/22/22 1335

## 2022-08-22 NOTE — Discharge Instructions (Signed)
Please start taking doxycycline twice daily until gone, for a total of 10 days. Drink plenty of water while on this antibiotic. Please also take the prednisone taper pack as directed per package instructions. Continue plain Mucinex with plenty of water, and your Flonase. I would also recommend you purchase the NeilMed sinus rinse, and rinse your sinus passages to remove any causative agents such as pollen or mold spores.  Please use your albuterol inhaler 2 puffs every 4-6 hours as needed. I have called in montelukast, a tablet to take nightly for the next 10 days to help with the cough. Follow up with your PCP if your symptoms persist upon completion of the medications.

## 2022-08-29 ENCOUNTER — Ambulatory Visit: Payer: Medicare Other | Admitting: Internal Medicine

## 2022-08-29 DIAGNOSIS — I2510 Atherosclerotic heart disease of native coronary artery without angina pectoris: Secondary | ICD-10-CM

## 2022-08-30 ENCOUNTER — Ambulatory Visit: Payer: Medicare Other | Admitting: Internal Medicine

## 2022-08-30 ENCOUNTER — Encounter: Payer: Self-pay | Admitting: Internal Medicine

## 2022-08-30 VITALS — BP 142/81 | HR 66 | Ht 70.0 in | Wt 199.4 lb

## 2022-08-30 DIAGNOSIS — I25118 Atherosclerotic heart disease of native coronary artery with other forms of angina pectoris: Secondary | ICD-10-CM

## 2022-08-30 DIAGNOSIS — E7800 Pure hypercholesterolemia, unspecified: Secondary | ICD-10-CM

## 2022-08-30 DIAGNOSIS — I10 Essential (primary) hypertension: Secondary | ICD-10-CM

## 2022-08-30 NOTE — Progress Notes (Signed)
Cancelled.  

## 2022-08-30 NOTE — Progress Notes (Signed)
Patient referred by [name], MD for follow-up visit for CAD  Subjective:   Alexander Burns, male    DOB: 28-Sep-1950, 72 y.o.   MRN: [id number]   Chief Complaint  Patient presents with   Coronary Artery Disease   Follow-up    Hypertension Pertinent negatives include no chest pain, palpitations or shortness of breath.  Hyperlipidemia Pertinent negatives include no chest pain or shortness of breath.  Coronary Artery Disease Pertinent negatives include no chest pain, leg swelling, palpitations or shortness of breath.    72 y/o [place] American male with hypertension, mixed hyperlipidemia, family h/o CAD, s/p mid LAD PCI (05/2021) for unstable angina.  Patient is here for a follow-up visit. He has been doing well since the last time he was here. No concerns or complaints today. He recently went to [place] and walked about 5 miles daily exploring the city. He did not have any chest pain or shortness of breath. He is exercising regularly now and is feeling really good. He notices that he can be outside working on his truck now without getting tired or diaphoretic. He is tolerating Crestor now and states he is taking it regularly. Patient denies chest pain, shortness of breath, palpitations, diaphoresis, syncope, edema, PND, orthopnea.    Current Outpatient Medications:    albuterol (VENTOLIN HFA) 108 (90 Base) MCG/ACT inhaler, Inhale 2 puffs into the lungs every 6 (six) hours as needed for wheezing or shortness of breath., Disp: , Rfl:    amLODipine (NORVASC) 5 MG tablet, TAKE 1 TABLET (5 MG TOTAL) BY MOUTH DAILY., Disp: 90 tablet, Rfl: 3   aspirin EC 81 MG tablet, TAKE 1 TABLET (81 MG TOTAL) BY MOUTH DAILY. SWALLOW WHOLE., Disp: 90 tablet, Rfl: 3   cholecalciferol (VITAMIN D) 1000 UNITS tablet, Take 1,000 Units by mouth every morning. , Disp: , Rfl:    doxycycline (VIBRAMYCIN) 100 MG capsule, Take 1 capsule (100 mg total) by mouth 2 (two) times daily., Disp: 20 capsule, Rfl:  0   fluticasone (FLONASE) 50 MCG/ACT nasal spray, Place 1 spray into both nostrils as needed., Disp: , Rfl:    montelukast (SINGULAIR) 10 MG tablet, Take 1 tablet (10 mg total) by mouth at bedtime., Disp: 10 tablet, Rfl: 0   pantoprazole (PROTONIX) 40 MG tablet, Take 40 mg by mouth daily., Disp: , Rfl:    rosuvastatin (CRESTOR) 40 MG tablet, Take 1 tablet (40 mg total) by mouth at bedtime., Disp: 90 tablet, Rfl: 3    Cardiovascular and other pertinent studies:  EKG 11/25/2021: Sinus rhythm 53 bpm Left atrial enlargement.  LVH ST depression   +   Extensive T-abnormality  -Secondary to hypertrophy -possible  Anterolateral and inferior ischemia.   08/30/2022: Sinus Rhythm, rate 62 bpm, with left atrial enlargement. Voltage criteria for LVH. ST depression  +  Extensive T-abnormality due to hypertrophy. Unchanged compared to prior.   Echocardiogram 05/06/2021:  Normal LV systolic function with EF 61%. Left ventricle cavity is normal  in size. 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 Septum  measures 1.7 cm. Normal global wall motion. Doppler evidence of grade II  (pseudonormal) diastolic dysfunction, elevated LAP.  Left atrial cavity is mildly dilated.  Moderate (Grade II) mitral regurgitation.  Mild to moderate tricuspid regurgitation. Estimated pulmonary artery  systolic pressure 26 mmHg.   Coronary intervention 05/06/2021: LM: Normal LAD: Mid LAD 75-80% stenoses. RFR 0.89 Lcx: No significant disease RCA: No significant disease   Successful percutaneous coronary intervention mid LAD (non-overlapping  stents)        PTCA and stent placement 3.5 X 20 mm Synergy drug-eluting stent, post dilatation with 3.75 mm [place]        PTCA and stent placement 4.0 X 16 mm Synergy drug-eluting stent, post dilatation with 4.0 mm [place]   Post PCI RFR 0.93  EKG 05/06/2021: Sinus rhythm 57 bpm  LVH Old anteroseptal infarct Anterolateral and inferior T wave inversion, possible  ischemia   Recent labs: 05/07/2021: Glucose 107, BUN/Cr 14/0.8. EGFR >60. Na/K 140/3.9. Rest of the CMP normal H/H 14/43. MCV 91. Platelets 188 BNP 24  01/12/2021: Glucose 98, BUN/Cr 16/1.1. EGFR 80. Na/K 141/4.2. Rest of the CMP normal H/H 12.8/37.7. MCV 89. Platelets 138 HbA1C N/A Chol 213, TG 80, HDL 49, LDL 148 TSH 0.9 normal   Review of Systems  Cardiovascular:  Negative for chest pain, dyspnea on exertion, leg swelling, palpitations and syncope.  Respiratory:  Negative for cough and shortness of breath.          Vitals:   08/30/22 1020  BP: (!) 142/81  Pulse: 66  SpO2: 97%      Body mass index is 28.61 kg/m. Filed Weights   08/30/22 1020  Weight: 199 lb 6.4 oz (90.4 kg)     Objective:   Physical Exam Vitals reviewed.  Constitutional:      General: He is not in acute distress. Neck:     Vascular: No JVD.  Cardiovascular:     Rate and Rhythm: Normal rate and regular rhythm.     Heart sounds: Normal heart sounds. No murmur heard. Pulmonary:     Effort: Pulmonary effort is normal.     Breath sounds: Normal breath sounds. No wheezing or rales.  Musculoskeletal:     Right lower leg: No edema.     Left lower leg: No edema.      ICD-10-CM   1. Hypercholesterolemia  E78.00 Lipid Panel With LDL/HDL Ratio    2. Essential hypertension  I10     3. Coronary artery disease of native artery of native heart with stable angina pectoris [facility])  I25.118            Assessment & Recommendations:   72 y/o [place] American male with hypertension, mixed hyperlipidemia, family h/o CAD, s/p mid LAD PCI (05/2021) for unstable angina  CAD: S/p PCI to mid LAD 05/06/2021 Continue aspirin 81 mg daily.  Plavix stopped early due to intractable cough, that has since resolved. Patient is exercising regularly and notices that he has more energy now. Denies chest pain, shortness of breath, diaphoresis even with max exertion  Hypertension: Continue current cardiac  medications. Encourage low-sodium diet, less than 2000 mg daily. BP always slightly elevated in office but runs <130/80 at home Follow-up in 6 months or sooner if needed.  Hyperlipidemia: Patient is tolerating Crestor 40mg  qHS He did not tolerate prior statins and refuses Repatha injections Repeat lipid panel ordered    Floydene Flock, DO, [facility] 08/30/2022, 10:53 AM Office: [phone]

## 2022-08-31 LAB — LIPID PANEL WITH LDL/HDL RATIO
Cholesterol, Total: 202 mg/dL — ABNORMAL HIGH (ref 100–199)
HDL: 77 mg/dL (ref >39)
LDL Chol Calc (NIH): 115 mg/dL — ABNORMAL HIGH (ref 0–99)
LDL/HDL Ratio: 1.5 ratio (ref 0.0–3.6)
Triglycerides: 53 mg/dL (ref 0–149)
VLDL Cholesterol Cal: 10 mg/dL (ref 5–40)

## 2022-09-23 ENCOUNTER — Emergency Department (HOSPITAL_COMMUNITY): Payer: No Typology Code available for payment source

## 2022-09-23 ENCOUNTER — Encounter (HOSPITAL_COMMUNITY): Payer: Self-pay

## 2022-09-23 ENCOUNTER — Other Ambulatory Visit: Payer: Self-pay

## 2022-09-23 ENCOUNTER — Ambulatory Visit (HOSPITAL_COMMUNITY)
Admission: EM | Admit: 2022-09-23 | Discharge: 2022-09-23 | Disposition: A | Discharge status: Home / Self Care | Payer: No Typology Code available for payment source

## 2022-09-23 ENCOUNTER — Emergency Department (HOSPITAL_COMMUNITY)
Admission: EM | Admit: 2022-09-23 | Discharge: 2022-09-23 | Disposition: A | Discharge status: Home / Self Care | Payer: No Typology Code available for payment source | Attending: Emergency Medicine | Admitting: Emergency Medicine

## 2022-09-23 DIAGNOSIS — Z79899 Other long term (current) drug therapy: Secondary | ICD-10-CM | POA: Insufficient documentation

## 2022-09-23 DIAGNOSIS — K529 Noninfective gastroenteritis and colitis, unspecified: Secondary | ICD-10-CM | POA: Diagnosis not present

## 2022-09-23 DIAGNOSIS — J45909 Unspecified asthma, uncomplicated: Secondary | ICD-10-CM | POA: Diagnosis not present

## 2022-09-23 DIAGNOSIS — I10 Essential (primary) hypertension: Secondary | ICD-10-CM | POA: Diagnosis not present

## 2022-09-23 DIAGNOSIS — I2510 Atherosclerotic heart disease of native coronary artery without angina pectoris: Secondary | ICD-10-CM | POA: Insufficient documentation

## 2022-09-23 DIAGNOSIS — Z7982 Long term (current) use of aspirin: Secondary | ICD-10-CM | POA: Insufficient documentation

## 2022-09-23 DIAGNOSIS — Z8551 Personal history of malignant neoplasm of bladder: Secondary | ICD-10-CM | POA: Insufficient documentation

## 2022-09-23 DIAGNOSIS — R1031 Right lower quadrant pain: Secondary | ICD-10-CM | POA: Diagnosis present

## 2022-09-23 LAB — CBC WITH DIFFERENTIAL/PLATELET
Abs Immature Granulocytes: 0.02 10*3/uL (ref 0.00–0.07)
Basophils Absolute: 0 10*3/uL (ref 0.0–0.1)
Basophils Relative: 1 %
Eosinophils Absolute: 0.1 10*3/uL (ref 0.0–0.5)
Eosinophils Relative: 2 %
HCT: 40.8 % (ref 39.0–52.0)
Hemoglobin: 13.4 g/dL (ref 13.0–17.0)
Immature Granulocytes: 0 %
Lymphocytes Relative: 22 %
Lymphs Abs: 1.6 10*3/uL (ref 0.7–4.0)
MCH: 30.2 pg (ref 26.0–34.0)
MCHC: 32.8 g/dL (ref 30.0–36.0)
MCV: 92.1 fL (ref 80.0–100.0)
Monocytes Absolute: 0.8 10*3/uL (ref 0.1–1.0)
Monocytes Relative: 11 %
Neutro Abs: 4.5 10*3/uL (ref 1.7–7.7)
Neutrophils Relative %: 64 %
Platelets: 173 10*3/uL (ref 150–400)
RBC: 4.43 MIL/uL (ref 4.22–5.81)
RDW: 12.7 % (ref 11.5–15.5)
WBC: 6.9 10*3/uL (ref 4.0–10.5)
nRBC: 0 % (ref 0.0–0.2)

## 2022-09-23 LAB — COMPREHENSIVE METABOLIC PANEL
ALT: 27 U/L (ref 0–44)
AST: 32 U/L (ref 15–41)
Albumin: 4 g/dL (ref 3.5–5.0)
Alkaline Phosphatase: 59 U/L (ref 38–126)
Anion gap: 9 (ref 5–15)
BUN: 12 mg/dL (ref 8–23)
CO2: 26 mmol/L (ref 22–32)
Calcium: 8.8 mg/dL — ABNORMAL LOW (ref 8.9–10.3)
Chloride: 102 mmol/L (ref 98–111)
Creatinine, Ser: 0.97 mg/dL (ref 0.61–1.24)
GFR, Estimated: >60 mL/min (ref >60)
Glucose, Bld: 95 mg/dL (ref 70–99)
Potassium: 3.7 mmol/L (ref 3.5–5.1)
Sodium: 137 mmol/L (ref 135–145)
Total Bilirubin: 0.8 mg/dL (ref 0.3–1.2)
Total Protein: 6.8 g/dL (ref 6.5–8.1)

## 2022-09-23 MED ORDER — SODIUM CHLORIDE (PF) 0.9 % IJ SOLN
INTRAMUSCULAR | Status: AC
  Filled 2022-09-23: qty 50

## 2022-09-23 MED ORDER — IOHEXOL 300 MG/ML  SOLN
100.0000 mL | Freq: Once | INTRAMUSCULAR | Status: AC | PRN
  Administered 2022-09-23: 100 mL via INTRAVENOUS

## 2022-09-23 MED ORDER — AMOXICILLIN-POT CLAVULANATE 875-125 MG PO TABS: 1.0000 | ORAL_TABLET | Freq: Two times a day (BID) | ORAL | 0 refills | Status: DC

## 2022-09-23 NOTE — Discharge Instructions (Signed)
Sent to the ED for higher level of care

## 2022-09-23 NOTE — ED Triage Notes (Signed)
Pt presents with c/o RLQ pain x 3 days. Pt describes the pain as pressure, puffiness and is uncomfortable. Denies discomfort or pain when voiding. Denies n/v/d.   Home interventions: none.

## 2022-09-23 NOTE — ED Provider Notes (Signed)
[place] EMERGENCY DEPARTMENT AT [facility] Provider Note   CSN: [id number] Arrival date & time: 09/23/22  1110     History  Chief Complaint  Patient presents with   Abdominal Pain    Alexander Burns is a 72 y.o. male.  Patient is a 72 year old male with a history of CAD status post 2 stents, hypertension, asthma prior bladder cancer who is presenting today with right lower quadrant pain.  Patient reports the pain started on Wednesday and has gradually worsened.  He notices it more when he is trying to sleep he can no longer lay on that side.  He also notices it more with eating.  No change in urine or bowel movements.  No nausea or vomiting.  Bowel movements have been regular.  He reports his appetite has been the same.  No recent change in activity where you could have strained himself.  He initially went to urgent care but after his exam they sent her here here for further evaluation.  The history is provided by the patient.  Abdominal Pain      Home Medications Prior to Admission medications   Medication Sig Start Date End Date Taking? Authorizing Provider  amoxicillin-clavulanate (AUGMENTIN) 875-125 MG tablet Take 1 tablet by mouth every 12 (twelve) hours. 09/23/22  Yes Buren Havey, Alphonzo Lemmings, MD  albuterol (VENTOLIN HFA) 108 (90 Base) MCG/ACT inhaler Inhale 2 puffs into the lungs every 6 (six) hours as needed for wheezing or shortness of breath.    [provider]  amLODipine (NORVASC) 5 MG tablet TAKE 1 TABLET (5 MG TOTAL) BY MOUTH DAILY. 06/17/22   Custovic, Rozell Searing, DO  aspirin EC 81 MG tablet TAKE 1 TABLET (81 MG TOTAL) BY MOUTH DAILY. SWALLOW WHOLE. 04/26/22   Patwardhan, Anabel Bene, MD  cholecalciferol (VITAMIN D) 1000 UNITS tablet Take 1,000 Units by mouth every morning.     [provider]  doxycycline (VIBRAMYCIN) 100 MG capsule Take 1 capsule (100 mg total) by mouth 2 (two) times daily. 08/22/22   Crain, Yara Tomkinson L, PA  fluticasone (FLONASE) 50  MCG/ACT nasal spray Place 1 spray into both nostrils as needed. 07/15/21   [provider]  montelukast (SINGULAIR) 10 MG tablet Take 1 tablet (10 mg total) by mouth at bedtime. 08/22/22   Crain, Broly Hatfield L, PA  pantoprazole (PROTONIX) 40 MG tablet Take 40 mg by mouth daily.    [provider]  rosuvastatin (CRESTOR) 40 MG tablet Take 1 tablet (40 mg total) by mouth at bedtime. 02/28/22 08/30/22  Custovic, Rozell Searing, DO      Allergies    Nabumetone    Review of Systems   Review of Systems  Gastrointestinal:  Positive for abdominal pain.    Physical Exam Updated Vital Signs BP (!) 150/75 (BP Location: Right Arm)   Pulse (!) 55   Temp 98.3 F (36.8 C) (Oral)   Resp 17   Ht 6' (1.829 m)   Wt 90.7 kg   SpO2 100%   BMI 27.12 kg/m  Physical Exam Vitals and nursing note reviewed.  Constitutional:      General: He is not in acute distress.    Appearance: He is well-developed.  HENT:     Head: Normocephalic and atraumatic.  Eyes:     Conjunctiva/sclera: Conjunctivae normal.     Pupils: Pupils are equal, round, and reactive to light.  Cardiovascular:     Rate and Rhythm: Normal rate and regular rhythm.     Heart sounds: No  murmur heard. Pulmonary:     Effort: Pulmonary effort is normal. No respiratory distress.     Breath sounds: Normal breath sounds. No wheezing or rales.  Abdominal:     General: There is no distension.     Palpations: Abdomen is soft.     Tenderness: There is abdominal tenderness in the right lower quadrant. There is no right CVA tenderness, left CVA tenderness, guarding or rebound.  Musculoskeletal:        General: No tenderness. Normal range of motion.     Cervical back: Normal range of motion and neck supple.  Skin:    General: Skin is warm and dry.     Findings: No erythema or rash.  Neurological:     Mental Status: He is alert and oriented to person, place, and time. Mental status is at baseline.  Psychiatric:        Behavior: Behavior  normal.     ED Results / Procedures / Treatments   Labs (all labs ordered are listed, but only abnormal results are displayed) Labs Reviewed  COMPREHENSIVE METABOLIC PANEL - Abnormal; Notable for the following components:      Result Value   Calcium 8.8 (*)    All other components within normal limits  CBC WITH DIFFERENTIAL/PLATELET  URINALYSIS, W/ REFLEX TO CULTURE (INFECTION SUSPECTED)    EKG None  Radiology CT ABDOMEN PELVIS W CONTRAST  Result Date: 09/23/2022 CLINICAL DATA:  Right lower quadrant abdominal pain and bloating for 2 days. EXAM: CT ABDOMEN AND PELVIS WITH CONTRAST TECHNIQUE: Multidetector CT imaging of the abdomen and pelvis was performed using the standard protocol following bolus administration of intravenous contrast. RADIATION DOSE REDUCTION: This exam was performed according to the departmental dose-optimization program which includes automated exposure control, adjustment of the mA and/or kV according to patient size and/or use of iterative reconstruction technique. CONTRAST:  OMNIPAQUE IOHEXOL 300 MG/ML  SOLN COMPARISON:  Noncontrast abdominopelvic CT 08/10/2016. FINDINGS: Lower chest: Clear lung bases. No significant pleural or pericardial effusion. Hepatobiliary: The liver is normal in density without suspicious focal abnormality. No evidence of gallstones, gallbladder wall thickening or biliary dilatation. Pancreas: Unremarkable. No pancreatic ductal dilatation or surrounding inflammatory changes. Spleen: Normal in size without focal abnormality. Adrenals/Urinary Tract: Stable chronic calcification within the right adrenal gland. The left adrenal gland appears normal. No evidence of urinary tract calculus, suspicious renal lesion or hydronephrosis. There are bilateral renal cysts for which no follow-up imaging is recommended. The bladder appears unremarkable for its degree of distention. Stomach/Bowel: No enteric contrast administered. The stomach appears  unremarkable for its degree of distention. There is no small bowel distension, wall thickening or surrounding inflammation. The appendix appears normal. Interval development of diffuse colonic wall thickening extending from the ileocecal valve to just beyond the hepatic flexure of the colon. Moderate surrounding inflammation. No drainable fluid collection, foreign body or extraluminal gas. No acute findings of the distal colon are seen. There is moderate diverticulosis of the descending and sigmoid colon. Vascular/Lymphatic: There are no enlarged abdominal or pelvic lymph nodes. Aortic and branch vessel atherosclerosis without evidence of large vessel occlusion or aneurysm. The portal, superior mesenteric and splenic veins are patent. Reproductive: The prostate gland and seminal vesicles appear unremarkable. Other: Intact abdominal wall. As above, inflammatory changes surrounding the right colon. No drainable fluid collection, ascites or free air. Musculoskeletal: No acute or significant osseous findings. Mild lumbar spondylosis. IMPRESSION: 1. Interval development of diffuse colonic wall thickening extending from the ileocecal valve to  just beyond the hepatic flexure of the colon with prominent surrounding inflammation, most consistent with colitis, likely infectious in etiology. No evidence of bowel perforation, obstruction or abscess. 2. Distal colonic diverticulosis without acute findings. The appendix appears normal. 3.  Aortic Atherosclerosis (ICD10-I70.0). Electronically Signed   By: Carey Bullocks M.D.   On: 09/23/2022 13:18    Procedures Procedures    Medications Ordered in ED Medications  iohexol (OMNIPAQUE) 300 MG/ML solution 100 mL (100 mLs Intravenous Contrast Given 09/23/22 1247)    ED Course/ Medical Decision Making/ A&P                             Medical Decision Making Amount and/or Complexity of Data Reviewed Labs: ordered. Decision-making details documented in ED  Course. Radiology: ordered and independent interpretation performed. Decision-making details documented in ED Course.  Risk Prescription drug management.   Pt with multiple medical problems and comorbidities and presenting today with a complaint that caries a high risk for morbidity and mortality.  Here with right-sided abdominal pain today.  Concern for possible appendicitis versus atypical diverticulitis versus kidney stone versus mass versus musculoskeletal.  No findings to suggest zoster.  Labs and imaging are pending.  Patient offered pain control but he declines at this time.  2:14 PM I independently interpreted patient's labs and CBC and CMP without acute findings.  I have independently visualized and interpreted pt's images today.  CT negative for appendicitis, kidney stone.  Radiology reports interval development of diffuse colonic wall thickening extending from the ileocecal valve to just beyond the hepatic flexure of the colon with surrounding inflammation most consistent with colitis which they feel is likely infectious in nature.  Findings discussed with the patient.  He remains well-appearing and reports he is ready to go.  Discussed with him the findings of the CAT scan results.  Will start him on Augmentin.  His last colonoscopy was less than a year ago no evidences of masses at that time.  Discussed return precautions and follow-up instructions.  He is comfortable with this plan and discharged in good condition.          Final Clinical Impression(s) / ED Diagnoses Final diagnoses:  Colitis    Rx / DC Orders ED Discharge Orders          Ordered    amoxicillin-clavulanate (AUGMENTIN) 875-125 MG tablet  Every 12 hours        09/23/22 1413              Gwyneth Sprout, MD 09/23/22 1415

## 2022-09-23 NOTE — ED Provider Notes (Signed)
[facility]    CSN: [id number] Arrival date & time: 09/23/22  1003      History   Chief Complaint Chief Complaint  Patient presents with   Abdominal Pain    HPI Alexander Burns is a 72 y.o. male.  Wednesday night started with right-sided abdominal pain. He cannot rate on a pain scale given history of other chronic pains but denies being 10/10 He feels worse when laying on his right side, feels like breathing is affected. Normal bowel movements, last was this morning.  Denies any straining, pain, hematochezia.  Urinating normally without pain or hematuria. No fevers, chills, nausea, vomiting.  Eating and drinking normally. He denies any history of abdominal problems.  No prior surgeries. No rash noted.  Hx of bladder cancer, CHF  Past Medical History:  Diagnosis Date   Asthma    Bladder cancer 2018   Coronary artery disease    Hypertension     Patient Active Problem List   Diagnosis Date Noted   Hypercholesterolemia 02/28/2022   Mixed hyperlipidemia 06/30/2021   Essential hypertension 06/29/2021   Coronary artery disease involving native coronary artery of native heart without angina pectoris    Unstable angina 05/06/2021   Other allergic rhinitis 11/14/2017   Laryngopharyngeal reflux (LPR) 11/14/2017   Chronic diastolic CHF (congestive heart failure) 05/07/2014   Shortness of breath 05/06/2014   Moderate persistent asthma 05/06/2014    Past Surgical History:  Procedure Laterality Date   BACK SURGERY     CARDIAC CATHETERIZATION     CORONARY PRESSURE/FFR STUDY N/A 05/07/2021   Procedure: INTRAVASCULAR PRESSURE WIRE/FFR STUDY;  Surgeon: Elder Negus, MD;  Location: [facility];  Service: Cardiovascular;  Laterality: N/A;   CORONARY STENT INTERVENTION N/A 05/07/2021   Procedure: CORONARY STENT INTERVENTION;  Surgeon: Elder Negus, MD;  Location: [facility];  Service: Cardiovascular;  Laterality: N/A;   CORONARY  ULTRASOUND/IVUS N/A 05/07/2021   Procedure: Intravascular Ultrasound/IVUS;  Surgeon: Elder Negus, MD;  Location: [facility];  Service: Cardiovascular;  Laterality: N/A;   EUS N/A 11/14/2019   Procedure: LOWER ENDOSCOPIC ULTRASOUND (EUS);  Surgeon: Rachael Fee, MD;  Location: [facility];  Service: Endoscopy;  Laterality: N/A;   FLEXIBLE SIGMOIDOSCOPY N/A 11/14/2019   Procedure: FLEXIBLE SIGMOIDOSCOPY;  Surgeon: Rachael Fee, MD;  Location: [facility];  Service: Endoscopy;  Laterality: N/A;   FOOT SURGERY     LEFT HEART CATH AND CORONARY ANGIOGRAPHY N/A 05/07/2021   Procedure: LEFT HEART CATH AND CORONARY ANGIOGRAPHY;  Surgeon: Elder Negus, MD;  Location: [facility];  Service: Cardiovascular;  Laterality: N/A;   SUBMUCOSAL TATTOO INJECTION  11/14/2019   Procedure: SUBMUCOSAL TATTOO INJECTION;  Surgeon: Rachael Fee, MD;  Location: [facility];  Service: Endoscopy;;  mini probe ultrasound   TONSILLECTOMY       Home Medications    Prior to Admission medications   Medication Sig Start Date End Date Taking? Authorizing Provider  albuterol (VENTOLIN HFA) 108 (90 Base) MCG/ACT inhaler Inhale 2 puffs into the lungs every 6 (six) hours as needed for wheezing or shortness of breath.    [provider]  amLODipine (NORVASC) 5 MG tablet TAKE 1 TABLET (5 MG TOTAL) BY MOUTH DAILY. 06/17/22   Custovic, Rozell Searing, DO  aspirin EC 81 MG tablet TAKE 1 TABLET (81 MG TOTAL) BY MOUTH DAILY. SWALLOW WHOLE. 04/26/22   Patwardhan, Anabel Bene, MD  cholecalciferol (VITAMIN D) 1000 UNITS tablet Take  1,000 Units by mouth every morning.     [provider]  doxycycline (VIBRAMYCIN) 100 MG capsule Take 1 capsule (100 mg total) by mouth 2 (two) times daily. 08/22/22   Crain, Whitney L, PA  fluticasone (FLONASE) 50 MCG/ACT nasal spray Place 1 spray into both nostrils as needed. 07/15/21   [provider]  montelukast (SINGULAIR) 10 MG tablet Take 1 tablet  (10 mg total) by mouth at bedtime. 08/22/22   Crain, Whitney L, PA  pantoprazole (PROTONIX) 40 MG tablet Take 40 mg by mouth daily.    [provider]  rosuvastatin (CRESTOR) 40 MG tablet Take 1 tablet (40 mg total) by mouth at bedtime. 02/28/22 08/30/22  Custovic, Rozell Searing, DO    Family History Family History  Problem Relation Age of Onset   Diabetes Mother    Heart disease Father    Diabetes Father    Diabetes Sister    Breast cancer Sister    Heart attack Brother 92       first heart attack   Asthma Maternal Grandmother    Diabetes Other    Hypertension Other    CAD Other     Social History Social History   Tobacco Use   Smoking status: Former    Packs/day: 1.00    Years: 3.00    Additional pack years: 0.00    Total pack years: 3.00    Types: Cigarettes, Cigars    Quit date: 2000    Years since quitting: 24.3   Smokeless tobacco: Never  Vaping Use   Vaping Use: Never used  Substance Use Topics   Alcohol use: Yes    Comment: socially   Drug use: No     Allergies   Nabumetone   Review of Systems Review of Systems  Gastrointestinal:  Positive for abdominal pain.   As per HPI  Physical Exam Triage Vital Signs ED Triage Vitals [09/23/22 1031]  Enc Vitals Group     BP 136/74     Pulse Rate 60     Resp 17     Temp 98.5 F (36.9 C)     Temp Source Oral     SpO2 95 %     Weight      Height      Head Circumference      Peak Flow      Pain Score 0     Pain Loc      Pain Edu?      Excl. in GC?    No data found.  Updated Vital Signs BP 136/74 (BP Location: Left Arm)   Pulse 60   Temp 98.5 F (36.9 C) (Oral)   Resp 17   SpO2 95%    Physical Exam Vitals and nursing note reviewed.  Constitutional:      Appearance: Normal appearance. He is not ill-appearing.  HENT:     Mouth/Throat:     Mouth: Mucous membranes are moist.     Pharynx: Oropharynx is clear.  Eyes:     Conjunctiva/sclera: Conjunctivae normal.  Cardiovascular:     Rate  and Rhythm: Normal rate and regular rhythm.     Heart sounds: Normal heart sounds.  Pulmonary:     Effort: Pulmonary effort is normal.     Breath sounds: Normal breath sounds.  Abdominal:     Tenderness: There is abdominal tenderness in the right upper quadrant and right lower quadrant. There is no right CVA tenderness, left CVA tenderness, guarding or rebound.  Hernia: No hernia is present.     Comments: Right lower abdomen feels firm compared to Left lower. Possible mass palpated RLQ where pain is located. Patient with some involuntary guarding at first with RUQ palpation  Musculoskeletal:        General: Normal range of motion.     Cervical back: Normal range of motion.  Skin:    General: Skin is warm and dry.     Findings: No rash.     Comments: No rash or skin lesions  Neurological:     Mental Status: He is alert and oriented to person, place, and time.      UC Treatments / Results  Labs (all labs ordered are listed, but only abnormal results are displayed) Labs Reviewed - No data to display   EKG   Radiology No results found.  Procedures Procedures (including critical care time)  Medications Ordered in UC Medications - No data to display  Initial Impression / Assessment and Plan / UC Course  I have reviewed the triage vital signs and the nursing notes.  Pertinent labs & imaging results that were available during my care of the patient were reviewed by me and considered in my medical decision making (see chart for details).  Stable vitals. Not ill-appearing.  With abdominal exam and patient age I have recommended he be evaluated in the ED. Requires higher level of care and advanced imaging not available in the UC setting. Discharged via POV  Final Clinical Impressions(s) / UC Diagnoses   Final diagnoses:  Right lower quadrant abdominal pain     Discharge Instructions      Sent to the ED for higher level of care    ED Prescriptions   None     PDMP not reviewed this encounter.   Khadeem Rockett, Lurena Joiner, PA-C 09/23/22 1100

## 2022-09-23 NOTE — Discharge Instructions (Signed)
On your CAT scan today it showed colitis.  Your appendix looked fine and no signs of diverticulitis.  No signs of kidney stones.  You were prescribed an antibiotic that you will take for the next 7 days.  However in the meantime if your pain starts getting worse you start having a fever, vomiting you should return to the emergency room.

## 2022-09-23 NOTE — ED Triage Notes (Signed)
Pt to er, pt states that he ate out and had a sandwich on Wednesday and later that day started having some abd pain and gas, states that he has been hurting since then, states that he went to urgent care and they sent him here.  Pt states that he also feels very bloated.  Pt reports normal bm, denies nausea or vomiting.

## 2022-09-23 NOTE — ED Notes (Signed)
Patient is being discharged from the Urgent Care and sent to the Emergency Department via POV . Per Lurena Joiner Rising-PA, patient is in need of higher level of care due to abdominal pain. Patient is aware and verbalizes understanding of plan of care.  Vitals:   09/23/22 1031  BP: 136/74  Pulse: 60  Resp: 17  Temp: 98.5 F (36.9 C)  SpO2: 95%

## 2022-11-22 ENCOUNTER — Other Ambulatory Visit: Payer: Self-pay

## 2022-11-22 ENCOUNTER — Emergency Department (HOSPITAL_COMMUNITY): Payer: No Typology Code available for payment source

## 2022-11-22 ENCOUNTER — Encounter (HOSPITAL_COMMUNITY): Payer: Self-pay

## 2022-11-22 ENCOUNTER — Emergency Department (HOSPITAL_COMMUNITY)
Admission: EM | Admit: 2022-11-22 | Discharge: 2022-11-22 | Disposition: A | Discharge status: Home / Self Care | Payer: No Typology Code available for payment source | Attending: Emergency Medicine | Admitting: Emergency Medicine

## 2022-11-22 DIAGNOSIS — W010XXA Fall on same level from slipping, tripping and stumbling without subsequent striking against object, initial encounter: Secondary | ICD-10-CM | POA: Diagnosis not present

## 2022-11-22 DIAGNOSIS — S52502A Unspecified fracture of the lower end of left radius, initial encounter for closed fracture: Secondary | ICD-10-CM | POA: Diagnosis not present

## 2022-11-22 DIAGNOSIS — I110 Hypertensive heart disease with heart failure: Secondary | ICD-10-CM | POA: Insufficient documentation

## 2022-11-22 DIAGNOSIS — I2510 Atherosclerotic heart disease of native coronary artery without angina pectoris: Secondary | ICD-10-CM | POA: Diagnosis not present

## 2022-11-22 DIAGNOSIS — Z7982 Long term (current) use of aspirin: Secondary | ICD-10-CM | POA: Insufficient documentation

## 2022-11-22 DIAGNOSIS — J45909 Unspecified asthma, uncomplicated: Secondary | ICD-10-CM | POA: Diagnosis not present

## 2022-11-22 DIAGNOSIS — Z79899 Other long term (current) drug therapy: Secondary | ICD-10-CM | POA: Insufficient documentation

## 2022-11-22 DIAGNOSIS — I509 Heart failure, unspecified: Secondary | ICD-10-CM | POA: Insufficient documentation

## 2022-11-22 DIAGNOSIS — S6991XA Unspecified injury of right wrist, hand and finger(s), initial encounter: Secondary | ICD-10-CM | POA: Diagnosis present

## 2022-11-22 MED ORDER — FENTANYL CITRATE PF 50 MCG/ML IJ SOSY
100.0000 ug | PREFILLED_SYRINGE | Freq: Once | INTRAMUSCULAR | Status: AC
  Administered 2022-11-22: 100 ug via INTRAVENOUS
  Filled 2022-11-22: qty 2

## 2022-11-22 MED ORDER — LIDOCAINE-EPINEPHRINE (PF) 2 %-1:200000 IJ SOLN
10.0000 mL | Freq: Once | INTRAMUSCULAR | Status: AC
  Administered 2022-11-22: 10 mL via INTRADERMAL
  Filled 2022-11-22: qty 20

## 2022-11-22 MED ORDER — OXYCODONE-ACETAMINOPHEN 5-325 MG PO TABS: 1.0000 | ORAL_TABLET | Freq: Three times a day (TID) | ORAL | 0 refills | Status: AC | PRN

## 2022-11-22 NOTE — ED Triage Notes (Signed)
Pt BIB EMS from home after fall. Pt landed on left wrist, obvious injury noted per EMS. Splinted on arrival to ED. Pt denies LOC, no blood thinners. 18G R AC. fentanyl administered en route.VSS.

## 2022-11-22 NOTE — Progress Notes (Signed)
Orthopedic Tech Progress Note Patient Details:  Alexander Burns ZOXWRUE [birth year] [id number]  Applied sugar tong, sling is at the bedside. Ortho Devices Type of Ortho Device: Sugartong splint Ortho Device/Splint Location: LUE Ortho Device/Splint Interventions: Ordered, Application   Post Interventions Patient Tolerated: Well Instructions Provided: Care of device  Blase Mess 11/22/2022, 4:45 PM

## 2022-11-22 NOTE — ED Provider Notes (Signed)
[place] EMERGENCY DEPARTMENT AT [facility] Provider Note   CSN: [id number] Arrival date & time: 11/22/22  1301     History  Chief Complaint  Patient presents with   Fall   Wrist Injury    Alexander Burns is a 72 y.o. male.  HPI Patient presents after a fall.  Medical history includes asthma, CHF, CAD, HTN, HLD.  Shortly prior to arrival, patient had a mechanical fall due to slipping on the wet floor in his garage.  As he fell, he fell onto an outstretched left hand.  He suffered an injury to his left wrist area.  EMS placed a splint and gave him 100 mcg of fentanyl.  Pain is 9/10 in severity.  Patient denies striking his head.  He denies any other areas of pain or concern of injury.    Home Medications Prior to Admission medications   Medication Sig Start Date End Date Taking? Authorizing Provider  oxyCODONE-acetaminophen (PERCOCET/ROXICET) 5-325 MG tablet Take 1 tablet by mouth every 8 (eight) hours as needed for up to 5 days for severe pain. 11/22/22 11/27/22 Yes Gloris Manchester, MD  albuterol (VENTOLIN HFA) 108 (90 Base) MCG/ACT inhaler Inhale 2 puffs into the lungs every 6 (six) hours as needed for wheezing or shortness of breath.    [provider]  amLODipine (NORVASC) 5 MG tablet TAKE 1 TABLET (5 MG TOTAL) BY MOUTH DAILY. 06/17/22   Custovic, Rozell Searing, DO  amoxicillin-clavulanate (AUGMENTIN) 875-125 MG tablet Take 1 tablet by mouth every 12 (twelve) hours. 09/23/22   Gwyneth Sprout, MD  aspirin EC 81 MG tablet TAKE 1 TABLET (81 MG TOTAL) BY MOUTH DAILY. SWALLOW WHOLE. 04/26/22   Patwardhan, Anabel Bene, MD  cholecalciferol (VITAMIN D) 1000 UNITS tablet Take 1,000 Units by mouth every morning.     [provider]  doxycycline (VIBRAMYCIN) 100 MG capsule Take 1 capsule (100 mg total) by mouth 2 (two) times daily. 08/22/22   Crain, Whitney L, PA  fluticasone (FLONASE) 50 MCG/ACT nasal spray Place 1 spray into both nostrils as needed. 07/15/21   [provider]  montelukast (SINGULAIR) 10 MG tablet Take 1 tablet (10 mg total) by mouth at bedtime. 08/22/22   Crain, Whitney L, PA  pantoprazole (PROTONIX) 40 MG tablet Take 40 mg by mouth daily.    [provider]  rosuvastatin (CRESTOR) 40 MG tablet Take 1 tablet (40 mg total) by mouth at bedtime. 02/28/22 08/30/22  Custovic, Rozell Searing, DO      Allergies    Nabumetone    Review of Systems   Review of Systems  Musculoskeletal:  Positive for arthralgias.  All other systems reviewed and are negative.   Physical Exam Updated Vital Signs BP (!) 148/82 (BP Location: Left Arm)   Pulse (!) 56   Temp 98.6 F (37 C) (Oral)   Resp 20   Ht 6' (1.829 m)   Wt 88.9 kg   SpO2 97%   BMI 26.58 kg/m  Physical Exam Vitals and nursing note reviewed.  Constitutional:      General: He is not in acute distress.    Appearance: Normal appearance. He is well-developed. He is not ill-appearing, toxic-appearing or diaphoretic.  HENT:     Head: Normocephalic and atraumatic.     Right Ear: External ear normal.     Left Ear: External ear normal.     Nose: Nose normal.     Mouth/Throat:     Mouth: Mucous membranes are moist.  Eyes:  Extraocular Movements: Extraocular movements intact.     Conjunctiva/sclera: Conjunctivae normal.  Cardiovascular:     Rate and Rhythm: Normal rate and regular rhythm.  Pulmonary:     Effort: Pulmonary effort is normal. No respiratory distress.  Abdominal:     General: There is no distension.     Palpations: Abdomen is soft.  Musculoskeletal:        General: Swelling, tenderness, deformity and signs of injury present.     Cervical back: Normal range of motion and neck supple.  Skin:    General: Skin is warm and dry.     Capillary Refill: Capillary refill takes less than 2 seconds.     Coloration: Skin is not jaundiced or pale.  Neurological:     General: No focal deficit present.     Mental Status: He is alert and oriented to person, place, and time.   Psychiatric:        Mood and Affect: Mood normal.        Behavior: Behavior normal.        Thought Content: Thought content normal.        Judgment: Judgment normal.     ED Results / Procedures / Treatments   Labs (all labs ordered are listed, but only abnormal results are displayed) Labs Reviewed - No data to display  EKG None  Radiology DG Wrist Complete Left  Result Date: 11/22/2022 CLINICAL DATA:  Swelling after a fall. EXAM: LEFT WRIST - COMPLETE 3+ VIEW COMPARISON:  None Available. FINDINGS: Impacted, comminuted distal radius fracture with dorsal angulation. No dislocation. No definite intra-articular extension. Adjacent ulna intact with degenerative changes of the distal radioulnar joint. Scaphoid intact. Soft tissue swelling. IMPRESSION: Distal radius fracture as detailed above. No definite intra-articular extension. If higher sensitivity exclusion is desired, CT may be helpful. Electronically Signed   By: Jeronimo Greaves M.D.   On: 11/22/2022 14:12    Procedures .Ortho Injury Treatment  Date/Time: 11/22/2022 4:42 PM  Performed by: Gloris Manchester, MD Authorized by: Gloris Manchester, MD   Consent:    Consent obtained:  Verbal   Consent given by:  PatientInjury location: wrist Location details: left wrist Injury type: fracture Fracture type: distal radius Pre-procedure neurovascular assessment: neurovascularly intact Pre-procedure distal perfusion: normal Pre-procedure neurological function: normal Pre-procedure range of motion: reduced Anesthesia: hematoma block  Anesthesia: Local anesthesia used: yes Local Anesthetic: lidocaine 2% with epinephrine Anesthetic total: 5 mL  Patient sedated: NoManipulation performed: yes Skin traction used: yes Reduction successful: yes X-ray confirmed reduction: yes Immobilization: splint Splint type: sugar tong Splint Applied by: Milon Dikes Post-procedure neurovascular assessment: post-procedure neurovascularly intact Post-procedure  distal perfusion: normal Post-procedure neurological function: normal       Medications Ordered in ED Medications  fentaNYL (SUBLIMAZE) injection 100 mcg (100 mcg Intravenous Given 11/22/22 1316)  lidocaine-EPINEPHrine (XYLOCAINE W/EPI) 2 %-1:200000 (PF) injection 10 mL (10 mLs Intradermal Given by Other 11/22/22 1418)    ED Course/ Medical Decision Making/ A&P                             Medical Decision Making Amount and/or Complexity of Data Reviewed Radiology: ordered.  Risk Prescription drug management.   This patient presents to the ED for concern of fall, this involves an extensive number of treatment options, and is a complaint that carries with it a high risk of complications and morbidity.  The differential diagnosis includes acute injuries   Co morbidities  that complicate the patient evaluation  asthma, CHF, CAD, HTN, HLD   Additional history obtained:  Additional history obtained from EMS External records from outside source obtained and reviewed including EMR  Imaging Studies ordered:  I ordered imaging studies including left wrist x-ray I independently visualized and interpreted imaging which showed distal radius fracture I agree with the radiologist interpretation   Cardiac Monitoring: / EKG:  The patient was maintained on a cardiac monitor.  I personally viewed and interpreted the cardiac monitored which showed an underlying rhythm of: Sinus rhythm   Problem List / ED Course / Critical interventions / Medication management  Patient presents for left wrist injury.  This was due to a fall onto outstretched hand following a mechanical fall in his garage.  On arrival, patient is alert and oriented.  He is well-appearing on exam.  Left wrist is splinted.  Distal hand is neurovascularly intact.  Fentanyl was ordered for ongoing analgesia.  X-ray imaging shows distal radius fracture.  Hematoma block was performed.  Patient was placed in finger traps.   Manipulation was performed with reduction of fracture.  Sugar-tong splint was placed by [employer].  Repeat x-ray shows improved alignment.  Patient was advised to follow-up with orthopedic surgery.  He was discharged in stable condition. I ordered medication including fentanyl for analgesia; lidocaine for hematoma block Reevaluation of the patient after these medicines showed that the patient improved I have reviewed the patients home medicines and have made adjustments as needed   Social Determinants of Health:  Has PCP        Final Clinical Impression(s) / ED Diagnoses Final diagnoses:  Closed fracture of distal end of left radius, unspecified fracture morphology, initial encounter    Rx / DC Orders ED Discharge Orders          Ordered    oxyCODONE-acetaminophen (PERCOCET/ROXICET) 5-325 MG tablet  Every 8 hours PRN        11/22/22 1648              Gloris Manchester, MD 11/22/22 1649

## 2022-11-22 NOTE — Discharge Instructions (Addendum)
A prescription for narcotic pain medication was sent to pharmacy.  Take only as needed.  Call the telephone number below to set up follow-up appointment with the orthopedic doctor.

## 2023-01-26 ENCOUNTER — Other Ambulatory Visit: Payer: Self-pay | Admitting: Cardiology

## 2023-03-02 ENCOUNTER — Ambulatory Visit: Payer: Medicare Other | Admitting: Cardiology

## 2023-04-28 ENCOUNTER — Ambulatory Visit: Payer: Medicare Other | Attending: Internal Medicine | Admitting: Cardiology

## 2023-04-28 ENCOUNTER — Encounter: Payer: Self-pay | Admitting: Cardiology

## 2023-04-28 VITALS — BP 150/78 | HR 72 | Resp 16 | Ht 72.0 in | Wt 201.0 lb

## 2023-04-28 DIAGNOSIS — I2510 Atherosclerotic heart disease of native coronary artery without angina pectoris: Secondary | ICD-10-CM | POA: Diagnosis not present

## 2023-04-28 DIAGNOSIS — I517 Cardiomegaly: Secondary | ICD-10-CM | POA: Diagnosis not present

## 2023-04-28 DIAGNOSIS — I10 Essential (primary) hypertension: Secondary | ICD-10-CM | POA: Diagnosis not present

## 2023-04-28 DIAGNOSIS — E782 Mixed hyperlipidemia: Secondary | ICD-10-CM | POA: Diagnosis not present

## 2023-04-28 MED ORDER — METOPROLOL SUCCINATE ER 50 MG PO TB24: 50.0000 mg | ORAL_TABLET | Freq: Every day | ORAL | 3 refills | Status: DC

## 2023-04-28 NOTE — Patient Instructions (Signed)
Medication Instructions:  START METOPROLOL 50 MG A DAY  *If you need a refill on your cardiac medications before your next appointment, please call your pharmacy*   Lab Work:  If you have labs (blood work) drawn today and your tests are completely normal, you will receive your results only by: MyChart Message (if you have MyChart) OR A paper copy in the mail If you have any lab test that is abnormal or we need to change your treatment, we will call you to review the results.   Testing/Procedures:  Your physician has requested that you have an echocardiogram. Echocardiography is a painless test that uses sound waves to create images of your heart. It provides your doctor with information about the size and shape of your heart and how well your heart's chambers and valves are working. This procedure takes approximately one hour. There are no restrictions for this procedure. Please do NOT wear cologne, perfume, aftershave, or lotions (deodorant is allowed). Please arrive 15 minutes prior to your appointment time.  Please note: We ask at that you not bring children with you during ultrasound (echo/ vascular) testing. Due to room size and safety concerns, children are not allowed in the ultrasound rooms during exams. Our front office staff cannot provide observation of children in our lobby area while testing is being conducted. An adult accompanying a patient to their appointment will only be allowed in the ultrasound room at the discretion of the ultrasound technician under special circumstances. We apologize for any inconvenience.    Follow-Up: At [facility], you and your health needs are our priority.  As part of our continuing mission to provide you with exceptional heart care, we have created designated Provider Care Teams.  These Care Teams include your primary Cardiologist (physician) and Advanced Practice Providers (APPs -  Physician Assistants and Nurse Practitioners) who all  work together to provide you with the care you need, when you need it.  We recommend signing up for the patient portal called "MyChart".  Sign up information is provided on this After Visit Summary.  MyChart is used to connect with patients for Virtual Visits (Telemedicine).  Patients are able to view lab/test results, encounter notes, upcoming appointments, etc.  Non-urgent messages can be sent to your provider as well.   To learn more about what you can do with MyChart, go to [email or web address].    Your next appointment:  LIPID CLINIC FOR POSSIBLE [facility]QU[facility]IO  DR PATWARDHAN IN 6 MONTHS

## 2023-04-28 NOTE — Progress Notes (Signed)
Cardiology Office Note:  .   Date:  04/28/2023  ID:  Alexander Burns, DOB Nov 23, 1950, MRN [id number] PCP: Alysia Penna, MD  [facility] Providers Cardiologist:  Truett Mainland, MD PCP: Alysia Penna, MD  Chief Complaint  Patient presents with   Coronary artery disease involving native coronary artery of   Follow-up    6 month      History of Present Illness: .    Alexander Burns is a 72 y.o. male with hypertension, mixed hyperlipidemia, family h/o CAD, s/p mid LAD PCI (05/2021) for unstable angina   Patient is doing well, denies any chest pain symptoms.  He is not taking statin due to myalgias and intolerance symptoms in the past-including atorvastatin and rosuvastatin.  Vitals:   04/28/23 1035  BP: (!) 150/78  Pulse: 72  Resp: 16  SpO2: 96%     ROS:  Review of Systems  Cardiovascular:  Negative for chest pain, dyspnea on exertion, leg swelling, palpitations and syncope.  Musculoskeletal:  Positive for myalgias.     Studies Reviewed: Marland Kitchen       EKG 04/28/2023: Sinus bradycardia Possible Left atrial enlargement Left ventricular hypertrophy ( Sokolow-Lyon , Cornell product ) Marked ST abnormality, possible inferior subendocardial injury When compared with ECG of 07-May-2021 09:51, No significant change since     Echocardiogram 08/19/2021:  Normal LV systolic function with visual EF 60-65%. Left ventricle cavity  is normal in size. Moderate left ventricular hypertrophy. Normal global  wall motion. Indeterminate diastolic filling pattern, normal LAP.  Left atrial cavity is mildly dilated.  Trace tricuspid regurgitation. No pulmonary hypertension. RVSP measures 24  mmHg.  Compared to study 05/06/2021: Grade 2 DD is now indeterminate, moderate MR  is now resolved, mild/moderate TR is now trace, otherwise no significant  change.   Coronary intervention 05/07/2021: LM: Normal LAD: Mid LAD 75-80% stenoses. RFR 0.89 Lcx: No significant disease RCA:  No significant disease   Successful percutaneous coronary intervention mid LAD (non-overlapping stents)        PTCA and stent placement 3.5 X 20 mm Synergy drug-eluting stent, post dilatation with 3.75 mm [place]        PTCA and stent placement 4.0 X 16 mm Synergy drug-eluting stent, post dilatation with 4.0 mm [place]   Post PCI RFR 0.93    Physical Exam:   Physical Exam Vitals and nursing note reviewed.  Constitutional:      General: He is not in acute distress. Neck:     Vascular: No JVD.  Cardiovascular:     Rate and Rhythm: Normal rate and regular rhythm.     Heart sounds: Normal heart sounds. No murmur heard. Pulmonary:     Effort: Pulmonary effort is normal.     Breath sounds: Normal breath sounds. No wheezing or rales.  Musculoskeletal:     Right lower leg: No edema.     Left lower leg: No edema.      VISIT DIAGNOSES:   ICD-10-CM   1. Coronary artery disease involving native coronary artery of native heart without angina pectoris  I25.10 EKG 12-Lead       ASSESSMENT AND PLAN: .    Alexander Burns is a 72 y.o. male with hypertension, mixed hyperlipidemia, family h/o CAD, s/p mid LAD PCI (05/2021) for unstable angina    CAD: S/p PCI to mid LAD 05/06/2021. Continue aspirin 81 mg daily.  Plavix stopped early due to intractable cough, that has since resolved. Unable to tolerate statin due to  myalgias-including atorvastatin and rosuvastatin. Referral to lipid clinic for consideration of Leqvio.  Hypertension: Suboptimal control on amlodipine 5 mg daily. Given concern for possible hypertrophic cardiomyopathy, I will add him on metoprolol succinate 50 mg daily.  LVH:  Echocardiogram noted moderate asymmetric hypertrophy without any LVOT obstruction (08/2021).   No clinical symptoms s/o LVOT obstruction. He is reluctant to undergo MRI due to claustrophobia.  I will repeat echocardiogram to reevaluate his left ventricular hypertrophy.       No orders of the defined  types were placed in this encounter.    F/u in 6 months  Signed, Elder Negus, MD

## 2023-06-14 ENCOUNTER — Encounter: Payer: Self-pay | Admitting: Student

## 2023-06-14 ENCOUNTER — Ambulatory Visit: Payer: Medicare Other | Attending: Cardiology

## 2023-06-14 ENCOUNTER — Other Ambulatory Visit (HOSPITAL_COMMUNITY): Payer: Self-pay

## 2023-06-14 ENCOUNTER — Ambulatory Visit (INDEPENDENT_AMBULATORY_CARE_PROVIDER_SITE_OTHER): Payer: Medicare Other | Admitting: Student

## 2023-06-14 ENCOUNTER — Telehealth: Payer: Self-pay | Admitting: Pharmacist

## 2023-06-14 ENCOUNTER — Telehealth: Payer: Self-pay | Admitting: Pharmacy Technician

## 2023-06-14 VITALS — BP 138/72 | HR 65

## 2023-06-14 DIAGNOSIS — E782 Mixed hyperlipidemia: Secondary | ICD-10-CM | POA: Diagnosis present

## 2023-06-14 DIAGNOSIS — I10 Essential (primary) hypertension: Secondary | ICD-10-CM | POA: Diagnosis present

## 2023-06-14 DIAGNOSIS — E7800 Pure hypercholesterolemia, unspecified: Secondary | ICD-10-CM | POA: Insufficient documentation

## 2023-06-14 DIAGNOSIS — I517 Cardiomegaly: Secondary | ICD-10-CM | POA: Diagnosis present

## 2023-06-14 LAB — ECHOCARDIOGRAM COMPLETE: S' Lateral: 2.71 cm

## 2023-06-14 MED ORDER — EZETIMIBE 10 MG PO TABS: 10.0000 mg | ORAL_TABLET | Freq: Every day | ORAL | 3 refills | Status: DC

## 2023-06-14 NOTE — Assessment & Plan Note (Signed)
 Assessment and Plan: BP is uncontrolled in office BP 138/72 mmHg heart rate 65 (goal<130/80) Tolerates amlodipine  well but not metoprolol  due to joint pain, general weakness Patient has self adjusted metoprolol  dose from 50 mg daily to 50 mg 2-3 times per week home BP ~145-150/78 heart rate high 50's  SOB when he is active  Denies SOB at rest , palpitation, chest pain, headaches,or swelling Follows low salt diet willing to start 150 min per week cardio - such as walking 30 min per day Will get CMP to get updated baseline renal function and electrolytes level  Will be going for ECHO today  In future may consider adding long acting ARB or thiazide diuretics - depends on echo and labs result  Continue taking amlodipine  5 mg daily and metoprolol  ER 50 mg 2-3 times per week  Patient to keep record of BP readings with heart rate and report to us  at the next visit

## 2023-06-14 NOTE — Progress Notes (Signed)
 Patient ID: Alexander Burns                 DOB: September 23, 1950                    MRN: [id number]      HPI: Alexander Burns is a 73 y.o. male patient referred to lipid clinic by Dr.Patwardhan. PMH is significant for hypertension, mixed hyperlipidemia, family h/o CAD, s/p mid LAD PCI (05/2021) for unstable angina. Echocardiogram 08/19/2021- EF 60-65%  He is not taking statin due to myalgias and intolerance symptoms in the past-including atorvastatin  and rosuvastatin .  patient's BP was elevated at the last OV. Metoprolol  Er 50 mg was added to amlodipine  5 mg. Patient presented today reports he can not tolerate metoprolol  due to joint pain,SOB and general weakness so he has self adjusted dose to 50 mg 2-3 times per week, home BP ~145-150/78 heart rate high 50's   In the past he hs tried Lipitor and Crestor  various doses and he was unable to tolerate them due to severe myalgia. After his recent wrist injury he has stopped doing exercise. He get SOB when he is active ECHO is scheduled today.he watches salt and fat intake and does not eat out. Last lipid lab in March 2024 he was not on any lipid medication. He may have had lipid lab after March 2024 but we do not have result so will get updated lipid lab today with CMP for liver enzymes    Reviewed options for lowering LDL cholesterol, including ezetimibe , PCSK-9 inhibitors, bempedoic acid and inclisiran.  Discussed mechanisms of action, dosing, side effects and potential decreases in LDL cholesterol.  Also reviewed cost information and potential options for patient assistance.  Current Medications: none  Intolerances: Crestor  20 mg, and any lower doses, Lipitor - doses not recall doses - they both caused severe myalgia  Risk Factors: hypertension, mixed hyperlipidemia, family h/o CAD, s/p mid LAD PCI (05/2021) for unstable angina LDL goal: <70 mg/dl  Last lipid lab: LDLc 884, TC 202, TG 53, HDL 77 (08/2022) Diet: low salt low fat home cooked meals - does  not eat out   Exercise: none but willing to start cardio - 30 min walks everyday   Family History:  Relation Problem Comments  Mother (Deceased at age 33) Diabetes     Father (Deceased at age 5) Diabetes   Heart disease     Sister Metallurgist) Breast cancer   Diabetes     Sister Metallurgist)   Sister Metallurgist)   Sister Metallurgist)   Brother (Alive) Heart attack (Age: 76) first heart attack    Maternal Grandmother (Deceased)     Labs: Lipid Panel     Component Value Date/Time   CHOL 202 (H) 08/30/2022 1058   TRIG 53 08/30/2022 1058   HDL 77 08/30/2022 1058   CHOLHDL 3.6 01/04/2022 0829   LDLCALC 115 (H) 08/30/2022 1058   LABVLDL 10 08/30/2022 1058    Past Medical History:  Diagnosis Date   Asthma    Bladder cancer (HCC) 2018   Coronary artery disease    Hypertension     Current Outpatient Medications on File Prior to Visit  Medication Sig Dispense Refill   albuterol  (VENTOLIN  HFA) 108 (90 Base) MCG/ACT inhaler Inhale 2 puffs into the lungs every 6 (six) hours as needed for wheezing or shortness of breath.     amLODipine  (NORVASC ) 5 MG tablet TAKE 1 TABLET (5 MG TOTAL) BY MOUTH DAILY. 90 tablet  3   aspirin  EC 81 MG tablet TAKE 1 TABLET BY MOUTH DAILY. SWALLOW WHOLE. 90 tablet 3   cholecalciferol  (VITAMIN D) 1000 UNITS tablet Take 1,000 Units by mouth every morning.      fluticasone  (FLONASE ) 50 MCG/ACT nasal spray Place 1 spray into both nostrils as needed.     lidocaine  (LIDODERM ) 5 % Place 1 patch onto the skin daily.     metoprolol  succinate (TOPROL -XL) 50 MG 24 hr tablet Take 1 tablet (50 mg total) by mouth daily. Take with or immediately following a meal. 90 tablet 3   pantoprazole  (PROTONIX ) 40 MG tablet Take 40 mg by mouth daily.     No current facility-administered medications on file prior to visit.    Allergies  Allergen Reactions   Nabumetone Hives and Rash    Assessment/Plan:  1. Hyperlipidemia -  Problem  Hypercholesterolemia   Current Medications: none   Intolerances: Crestor  20 mg, and any lower doses, Lipitor - doses not recall doses - they both caused severe myalgia  Risk Factors: hypertension, mixed hyperlipidemia, family h/o CAD, s/p mid LAD PCI (05/2021) for unstable angina LDL goal: <70 mg/dl  Last lipid lab: LDLc 884, TC 202, TG 53, HDL 77 (08/2022)   Essential Hypertension   Essential hypertension Assessment and Plan: BP is uncontrolled in office BP 138/72 mmHg heart rate 65 (goal<130/80) Tolerates amlodipine  well but not metoprolol  due to joint pain, general weakness Patient has self adjusted metoprolol  dose from 50 mg daily to 50 mg 2-3 times per week home BP ~145-150/78 heart rate high 50's  SOB when he is active  Denies SOB at rest , palpitation, chest pain, headaches,or swelling Follows low salt diet willing to start 150 min per week cardio - such as walking 30 min per day Will get CMP to get updated baseline renal function and electrolytes level  Will be going for ECHO today  In future may consider adding long acting ARB or thiazide diuretics - depends on echo and labs result  Continue taking amlodipine  5 mg daily and metoprolol  ER 50 mg 2-3 times per week  Patient to keep record of BP readings with heart rate and report to us  at the next visit    Hypercholesterolemia Assessment:  LDL goal: < 70 mg/dl last LDLc 884 mg/dl (96/75) while not on any lipid lowering agent Intolerance to statins - Crestor  20 mg, and any lower doses, Lipitor - doses not recall doses - they both caused severe myalgia  Discussed next potential options (ezetimibe , PCSK-9 inhibitors, bempedoic acid and inclisiran); cost, dosing efficacy, side effects  Follows low salt low fat diet and eats mainly home cooked meals   Plan: Start taking ezetimibe  10 mg daily  Will apply for PA for PCSK9i; will inform patient upon approval Lipid lab due in 2-3 months of therapy optimization     Thank you,  Robbi Blanch, Pharm.D [facility]  Division of [facility] [address] [place], [place] [address]  Phone: [phone]; Fax: [phone]

## 2023-06-14 NOTE — Telephone Encounter (Signed)
 PA for PCSK9i requested

## 2023-06-14 NOTE — Telephone Encounter (Signed)
 Pharmacy Patient Advocate Encounter   Received notification from Pt Calls Messages that prior authorization for repatha is required/requested.   Insurance verification completed.   The patient is insured through [facility] .   Per test claim: PA required; PA submitted to above mentioned insurance via CoverMyMeds Key/confirmation #/EOC [id number] Status is pending

## 2023-06-14 NOTE — Patient Instructions (Signed)
 Your Results:             Your most recent labs Goal  Total Cholesterol 202  < 200  Triglycerides 53 < 150  HDL (happy/good cholesterol) 77 > 40  LDL (lousy/bad cholesterol 115 < 70   Medication changes: ezetimibe  10 mg daily. We will start the process to get PCSK9i (Praluent or Repatha)  covered by your insurance.  Once the prior authorization is complete, we will call you to let you know and confirm pharmacy information.    Praluent is a cholesterol medication that improved your body's ability to get rid of bad cholesterol known as LDL. It can lower your LDL up to 60%. It is an injection that is given under the skin every 2 weeks. The most common side effects of Praluent include runny nose, symptoms of the common cold, rarely flu or flu-like symptoms, back/muscle pain in about 3-4% of the patients, and redness, pain, or bruising at the injection site.    Repatha is a cholesterol medication that improved your body's ability to get rid of bad cholesterol known as LDL. It can lower your LDL up to 60%! It is an injection that is given under the skin every 2 weeks. The medication often requires a prior authorization from your insurance company. We will take care of submitting all the necessary information to your insurance company to get it approved. The most common side effects of Repatha include runny nose, symptoms of the common cold, rarely flu or flu-like symptoms, back/muscle pain in about 3-4% of the patients, and redness, pain, or bruising at the injection site.   Lab orders: We want to repeat labs after 2-3 months.  We will send you a lab order to remind you once we get closer to that time.

## 2023-06-14 NOTE — Assessment & Plan Note (Signed)
 Assessment:  LDL goal: < 70 mg/dl last LDLc 884 mg/dl (96/75) while not on any lipid lowering agent Intolerance to statins - Crestor  20 mg, and any lower doses, Lipitor - doses not recall doses - they both caused severe myalgia  Discussed next potential options (ezetimibe , PCSK-9 inhibitors, bempedoic acid and inclisiran); cost, dosing efficacy, side effects  Follows low salt low fat diet and eats mainly home cooked meals   Plan: Start taking ezetimibe  10 mg daily  Will apply for PA for PCSK9i; will inform patient upon approval Lipid lab due in 2-3 months of therapy optimization

## 2023-06-15 ENCOUNTER — Other Ambulatory Visit (HOSPITAL_COMMUNITY): Payer: Self-pay

## 2023-06-15 ENCOUNTER — Other Ambulatory Visit: Payer: Self-pay | Admitting: Internal Medicine

## 2023-06-15 LAB — COMPREHENSIVE METABOLIC PANEL
ALT: 22 [IU]/L (ref 0–44)
AST: 25 [IU]/L (ref 0–40)
Albumin: 4.5 g/dL (ref 3.8–4.8)
Alkaline Phosphatase: 88 [IU]/L (ref 44–121)
BUN/Creatinine Ratio: 13 (ref 10–24)
BUN: 12 mg/dL (ref 8–27)
Bilirubin Total: 0.7 mg/dL (ref 0.0–1.2)
CO2: 27 mmol/L (ref 20–29)
Calcium: 9.8 mg/dL (ref 8.6–10.2)
Chloride: 100 mmol/L (ref 96–106)
Creatinine, Ser: 0.9 mg/dL (ref 0.76–1.27)
Globulin, Total: 2.5 g/dL (ref 1.5–4.5)
Glucose: 91 mg/dL (ref 70–99)
Potassium: 4.5 mmol/L (ref 3.5–5.2)
Sodium: 140 mmol/L (ref 134–144)
Total Protein: 7 g/dL (ref 6.0–8.5)
eGFR: 90 mL/min/{1.73_m2} (ref >59)

## 2023-06-15 LAB — LIPID PANEL
Chol/HDL Ratio: 3.2 {ratio} (ref 0.0–5.0)
Cholesterol, Total: 200 mg/dL — ABNORMAL HIGH (ref 100–199)
HDL: 63 mg/dL (ref >39)
LDL Chol Calc (NIH): 126 mg/dL — ABNORMAL HIGH (ref 0–99)
Triglycerides: 59 mg/dL (ref 0–149)
VLDL Cholesterol Cal: 11 mg/dL (ref 5–40)

## 2023-06-15 NOTE — Progress Notes (Signed)
 LDL 126. Agree with PCSK9i.  Thanks MJP

## 2023-06-15 NOTE — Telephone Encounter (Signed)
 Pharmacy Patient Advocate Encounter  Received notification from [employer] that Prior Authorization for repatha has been APPROVED from 06/14/23 to 12/12/23. Ran test claim, Copay is $387.00 one month (DEDUCTIBLE)    . This test claim was processed through [facility] copay amounts may vary at other pharmacies due to pharmacy/plan contracts, or as the patient moves through the different stages of their insurance plan.   PA #/Case ID/Reference #: [id number]

## 2023-06-15 NOTE — Progress Notes (Signed)
 Thickening of heart muscle out of proportion to hypertension. Suspect he may hypertrophic cardiomyopathy. Recommend cardiac MRI for further evaluation.  Thanks MJP

## 2023-06-16 ENCOUNTER — Telehealth: Payer: Self-pay | Admitting: *Deleted

## 2023-06-16 ENCOUNTER — Telehealth: Payer: Self-pay | Admitting: Pharmacist

## 2023-06-16 ENCOUNTER — Encounter: Payer: Self-pay | Admitting: *Deleted

## 2023-06-16 DIAGNOSIS — R931 Abnormal findings on diagnostic imaging of heart and coronary circulation: Secondary | ICD-10-CM

## 2023-06-16 DIAGNOSIS — I517 Cardiomegaly: Secondary | ICD-10-CM

## 2023-06-16 NOTE — Telephone Encounter (Signed)
-----   Message from [facility] sent at 06/15/2023  4:40 PM EST ----- LDL 126. Agree with PCSK9i.  Thanks MJP

## 2023-06-16 NOTE — Telephone Encounter (Signed)
 Call to discuss lipid result and inform about Repatha approval and co-pay. N/A LVM.

## 2023-06-16 NOTE — Telephone Encounter (Signed)
-----   Message from Nurse Roxie PARAS sent at 06/16/2023  8:17 AM EST -----  ----- Message ----- From: Elmira Newman PARAS, MD Sent: 06/15/2023   4:39 PM EST To: Cv Div Ch St Triage  Thickening of heart muscle out of proportion to hypertension. Suspect he may hypertrophic cardiomyopathy. Recommend cardiac MRI for further evaluation.  Thanks MJP

## 2023-06-16 NOTE — Telephone Encounter (Signed)
 The patient has been notified of the result and verbalized understanding.  All questions (if any) were answered.  Pt aware we will order for him to get a CMR done, for echo suspicious of HOCM.  Pt is aware that I will place the order in the system and send a message to our CMR Scheduler to call him back to arrange this appt.   Pt states he is extremely claustrophobic, and will require pre-med for the CMR.   Pt aware that I will send this message back to Dr. Elmira to obtain an order for pre-med/sedation, for him to take prior to the CMR.   Pt aware that I will follow-up with him next week with what pre-medication for claustrophobia is recommended by Dr. Elmira.  Pt is also aware I will send him the CMR instructions to follow prior to when the test is scheduled.   Pt verbalized understanding and agrees with this plan.

## 2023-06-18 NOTE — Telephone Encounter (Signed)
 Could send Valium 5 mg, 2 pills no refills. Take 1 pill an hour before MRI, and repeat another one if needed.  Thanks MJP

## 2023-06-19 MED ORDER — DIAZEPAM 5 MG PO TABS: ORAL_TABLET | ORAL | 0 refills | Status: AC

## 2023-06-19 NOTE — Telephone Encounter (Signed)
 Called the pts pharmacy [facility] and phoned in pre-medication Valium , as indicated in this message.  Phoned this into the Pharmacist at [facility].   Left the pt a message to call the office back so that I can discuss pre-med sent in for him, and to remind him again that he must have a driver on the day of his CMR (scheduled for 07/19/23 at 4 pm), do to pre-med (Valium ) that will be taken.   Will endorse driving protocol and pre-med instructions to the pt, when he returns a call back to the office

## 2023-06-19 NOTE — Addendum Note (Signed)
 Addended by: Loa Socks on: 06/19/2023 11:01 AM   Modules accepted: Orders

## 2023-06-19 NOTE — Telephone Encounter (Signed)
 Spoke with the pt on the phone.  He is aware that we sent in Valium  for pre-medication for upcoming CMR on 07/19/23.  Pt is aware to take Valium  5 mg po 1 hr prior to CMR, for claustrophobia/anxiety.  He is aware that he can repeat another tablet if needed.  He is aware that we only dispensed 2 tablets for him to have.  Pt is aware that he will be required to have a driver to and from his CMR appt on 2/12.   Pt aware that this medication was phoned into the Pharmacist at [facility] on [address], and he should pick this up to have for his CMR appt on 2/12.  Pt verbalized understanding and agrees with this plan.  Pt was more than gracious for all the assistance provided with this matter.

## 2023-06-19 NOTE — Telephone Encounter (Signed)
 Patient has [employer], requesting the office notes so he will take it to the [facility] - PCP and will get that approved by them so the cost would be affordable. Will pick up office visit notes from Jun 14, 2023 on Wed Jan 15,2025 at [facility] office.

## 2023-06-21 ENCOUNTER — Telehealth: Payer: Self-pay | Admitting: Cardiology

## 2023-06-21 NOTE — Telephone Encounter (Signed)
 Patient came in to pick up medication. Patient stated dr had called to pick up but did not see medication in admin office cabinet. Told patient I could call triage to help out, but patient did not want to wait, said he would wait for a call.

## 2023-06-22 NOTE — Telephone Encounter (Signed)
As copied below, looks like our lipid clinic and Pharmacist advised the pt to pick up OV notes from 1/8 on 06/21/23.  Will route to PharmD for further management of this message.    June 19, 2023 Alexander Burns [facility]     06/19/23 10:21 AM Note Patient has [employer], requesting the office notes so he will take it to the [facility] - PCP and will get that approved by them so the cost would be affordable. Will pick up office visit notes from Jun 14, 2023 on Wed Jan 15,2025 at [facility] office.

## 2023-06-22 NOTE — Telephone Encounter (Signed)
Spoke to pt, left OV visit notes at front desk at [facility] office for him to pick up.

## 2023-07-19 ENCOUNTER — Other Ambulatory Visit: Payer: Self-pay | Admitting: Cardiology

## 2023-07-19 ENCOUNTER — Ambulatory Visit (HOSPITAL_COMMUNITY)
Admission: RE | Admit: 2023-07-19 | Discharge: 2023-07-19 | Disposition: A | Discharge status: Home / Self Care | Payer: Medicare Other | Source: Ambulatory Visit | Attending: Cardiology | Admitting: Cardiology

## 2023-07-19 DIAGNOSIS — I517 Cardiomegaly: Secondary | ICD-10-CM

## 2023-07-19 DIAGNOSIS — R931 Abnormal findings on diagnostic imaging of heart and coronary circulation: Secondary | ICD-10-CM | POA: Insufficient documentation

## 2023-07-19 MED ORDER — GADOBUTROL 1 MMOL/ML IV SOLN
10.0000 mL | Freq: Once | INTRAVENOUS | Status: AC | PRN
  Administered 2023-07-19: 10 mL via INTRAVENOUS

## 2023-07-19 NOTE — Telephone Encounter (Signed)
Call to f/u on Repatha coverage from [place]. N/A LVM to call back on [phone]

## 2023-07-20 ENCOUNTER — Telehealth: Payer: Self-pay | Admitting: *Deleted

## 2023-07-20 MED ORDER — AMLODIPINE BESYLATE 5 MG PO TABS: 5.0000 mg | ORAL_TABLET | Freq: Every day | ORAL | 3 refills | Status: AC

## 2023-07-20 NOTE — Telephone Encounter (Signed)
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 This is a relatively safe variant of hypertrophic cardiomyopathy, risk of sudden death is low at his age. In addition, there is also mild scar from prior injury to heart muscle from heart artery blockages. Continue current medications. I hope you will see the pain clinic and get started on injectable agents soon.  Thanks MJP

## 2023-07-20 NOTE — Telephone Encounter (Signed)
The patient has been notified of the result and verbalized understanding.  All questions (if any) were answered.  He requested a refill of his amlodipine, for he is almost out.  Sent refill to his confirmed pharmacy of choice.  He is awaiting his repatha approval, through our lipid clinic.  PharmD is working on this through the [place] for the pt.   Pt verbalized understanding and agrees with this plan.

## 2023-07-20 NOTE — Progress Notes (Signed)
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 This is a relatively safe variant of hypertrophic cardiomyopathy, risk of sudden death is low at his age. In addition, there is also mild scar from prior injury to heart muscle from heart artery blockages. Continue current medications. I hope you will see the pain clinic and get started on injectable agents soon.  Thanks MJP

## 2023-08-02 NOTE — Telephone Encounter (Signed)
 Spoke to patient, he got approval from [place] fro Repatha. Will be going to get prescription from [place] on Monday will call us back if he has any question.

## 2023-08-09 ENCOUNTER — Ambulatory Visit (HOSPITAL_COMMUNITY)
Admission: EM | Admit: 2023-08-09 | Discharge: 2023-08-09 | Disposition: A | Discharge status: Home / Self Care | Attending: Emergency Medicine | Admitting: Emergency Medicine

## 2023-08-09 ENCOUNTER — Ambulatory Visit (INDEPENDENT_AMBULATORY_CARE_PROVIDER_SITE_OTHER)

## 2023-08-09 ENCOUNTER — Encounter (HOSPITAL_COMMUNITY): Payer: Self-pay

## 2023-08-09 DIAGNOSIS — R051 Acute cough: Secondary | ICD-10-CM

## 2023-08-09 DIAGNOSIS — J069 Acute upper respiratory infection, unspecified: Secondary | ICD-10-CM

## 2023-08-09 DIAGNOSIS — J4521 Mild intermittent asthma with (acute) exacerbation: Secondary | ICD-10-CM | POA: Diagnosis not present

## 2023-08-09 LAB — POC COVID19/FLU A&B COMBO
Covid Antigen, POC: NEGATIVE
Influenza A Antigen, POC: NEGATIVE
Influenza B Antigen, POC: NEGATIVE

## 2023-08-09 MED ORDER — ACETAMINOPHEN 325 MG PO TABS
650.0000 mg | ORAL_TABLET | Freq: Once | ORAL | Status: AC
  Administered 2023-08-09: 650 mg via ORAL

## 2023-08-09 MED ORDER — ACETAMINOPHEN 325 MG PO TABS
ORAL_TABLET | ORAL | Status: AC
  Filled 2023-08-09: qty 2

## 2023-08-09 MED ORDER — PREDNISONE 20 MG PO TABS: 40.0000 mg | ORAL_TABLET | Freq: Every day | ORAL | 0 refills | Status: AC

## 2023-08-09 MED ORDER — BENZONATATE 100 MG PO CAPS: 100.0000 mg | ORAL_CAPSULE | Freq: Three times a day (TID) | ORAL | 0 refills | Status: DC

## 2023-08-09 NOTE — ED Provider Notes (Addendum)
 [facility]    CSN: [id number] Arrival date & time: 08/09/23  1344      History   Chief Complaint Chief Complaint  Patient presents with   Cough   Generalized Body Aches   Chills    HPI Alexander Burns is a 73 y.o. male.   Patient presents to clinic for complaints of generalized bodyaches, fatigue, productive cough with sputum, wheezing and shortness of breath for the past three days.  He did vomit 4 times yesterday, has not vomited at all today.  He is not nauseous currently. Diminished appetite.   Does have a history of asthma and has been using his inhaler frequently as well as nebulizer treatments without much improvement.  Has been taking Robitussin and Mucinex without much improvement.  The history is provided by the patient and medical records.  Cough   Past Medical History:  Diagnosis Date   Asthma    Bladder cancer (HCC) 2018   Coronary artery disease    Hypertension     Patient Active Problem List   Diagnosis Date Noted   LVH (left ventricular hypertrophy) 04/28/2023   Hypercholesterolemia 02/28/2022   Mixed hyperlipidemia 06/30/2021   Essential hypertension 06/29/2021   Coronary artery disease involving native coronary artery of native heart without angina pectoris    Unstable angina (HCC) 05/06/2021   Other allergic rhinitis 11/14/2017   Laryngopharyngeal reflux (LPR) 11/14/2017   Chronic diastolic CHF (congestive heart failure) (HCC) 05/07/2014   Shortness of breath 05/06/2014   Moderate persistent asthma 05/06/2014    Past Surgical History:  Procedure Laterality Date   BACK SURGERY     CARDIAC CATHETERIZATION     CORONARY PRESSURE/FFR STUDY N/A 05/07/2021   Procedure: INTRAVASCULAR PRESSURE WIRE/FFR STUDY;  Surgeon: Elder Negus, MD;  Location: [facility];  Service: Cardiovascular;  Laterality: N/A;   CORONARY STENT INTERVENTION N/A 05/07/2021   Procedure: CORONARY STENT INTERVENTION;  Surgeon: Elder Negus,  MD;  Location: [facility];  Service: Cardiovascular;  Laterality: N/A;   CORONARY ULTRASOUND/IVUS N/A 05/07/2021   Procedure: Intravascular Ultrasound/IVUS;  Surgeon: Elder Negus, MD;  Location: [facility];  Service: Cardiovascular;  Laterality: N/A;   EUS N/A 11/14/2019   Procedure: LOWER ENDOSCOPIC ULTRASOUND (EUS);  Surgeon: Rachael Fee, MD;  Location: [facility];  Service: Endoscopy;  Laterality: N/A;   FLEXIBLE SIGMOIDOSCOPY N/A 11/14/2019   Procedure: FLEXIBLE SIGMOIDOSCOPY;  Surgeon: Rachael Fee, MD;  Location: [facility];  Service: Endoscopy;  Laterality: N/A;   FOOT SURGERY     LEFT HEART CATH AND CORONARY ANGIOGRAPHY N/A 05/07/2021   Procedure: LEFT HEART CATH AND CORONARY ANGIOGRAPHY;  Surgeon: Elder Negus, MD;  Location: [facility];  Service: Cardiovascular;  Laterality: N/A;   SUBMUCOSAL TATTOO INJECTION  11/14/2019   Procedure: SUBMUCOSAL TATTOO INJECTION;  Surgeon: Rachael Fee, MD;  Location: [facility];  Service: Endoscopy;;  mini probe ultrasound   TONSILLECTOMY     WRIST FRACTURE SURGERY         Home Medications    Prior to Admission medications   Medication Sig Start Date End Date Taking? Authorizing Provider  benzonatate (TESSALON) 100 MG capsule Take 1 capsule (100 mg total) by mouth every 8 (eight) hours. 08/09/23  Yes Rinaldo Ratel, [place], FNP  predniSONE (DELTASONE) 20 MG tablet Take 2 tablets (40 mg total) by mouth daily for 5 days. 08/09/23 08/14/23 Yes Rinaldo Ratel, [place] N, FNP  albuterol (VENTOLIN HFA) 108 (  90 Base) MCG/ACT inhaler Inhale 2 puffs into the lungs every 6 (six) hours as needed for wheezing or shortness of breath.    [provider]  amLODipine (NORVASC) 5 MG tablet Take 1 tablet (5 mg total) by mouth daily. 07/20/23   Patwardhan, Anabel Bene, MD  aspirin EC 81 MG tablet TAKE 1 TABLET BY MOUTH DAILY. SWALLOW WHOLE. 01/27/23   Patwardhan, Anabel Bene, MD  cholecalciferol (VITAMIN D) 1000 UNITS  tablet Take 1,000 Units by mouth every morning.     [provider]  diazepam (VALIUM) 5 MG tablet Take 1 tablet (5 mg total) by mouth 1 hour prior to MRI for claustrophobia.  May repeat another 1 tablet (5 mg total) if needed. 06/19/23   Patwardhan, Anabel Bene, MD  ezetimibe (ZETIA) 10 MG tablet Take 1 tablet (10 mg total) by mouth daily. 06/14/23 09/12/23  Wendall Stade, MD  fluticasone (FLONASE) 50 MCG/ACT nasal spray Place 1 spray into both nostrils as needed. 07/15/21   [provider]  lidocaine (LIDODERM) 5 % Place 1 patch onto the skin daily. 04/24/23   [provider]  metoprolol succinate (TOPROL-XL) 50 MG 24 hr tablet Take 1 tablet (50 mg total) by mouth daily. Take with or immediately following a meal. 04/28/23   Patwardhan, Manish J, MD  pantoprazole (PROTONIX) 40 MG tablet Take 40 mg by mouth daily.    [provider]    Family History Family History  Problem Relation Age of Onset   Diabetes Mother    Heart disease Father    Diabetes Father    Diabetes Sister    Breast cancer Sister    Heart attack Brother 41       first heart attack   Asthma Maternal Grandmother    Diabetes Other    Hypertension Other    CAD Other     Social History Social History   Tobacco Use   Smoking status: Former    Current packs/day: 0.00    Average packs/day: 1 pack/day for 3.0 years (3.0 ttl pk-yrs)    Types: Cigarettes, Cigars    Start date: 27    Quit date: 2000    Years since quitting: 25.1   Smokeless tobacco: Never  Vaping Use   Vaping status: Never Used  Substance Use Topics   Alcohol use: Yes    Comment: socially   Drug use: No     Allergies   Nabumetone   Review of Systems Review of Systems  Per HPI   Physical Exam Triage Vital Signs ED Triage Vitals  Encounter Vitals Group     BP 08/09/23 1445 (!) 160/82     Systolic BP Percentile --      Diastolic BP Percentile --      Pulse Rate 08/09/23 1445 82     Resp 08/09/23 1445 16      Temp 08/09/23 1445 100.1 F (37.8 C)     Temp Source 08/09/23 1445 Oral     SpO2 08/09/23 1445 93 %     Weight --      Height --      Head Circumference --      Peak Flow --      Pain Score 08/09/23 1444 4     Pain Loc --      Pain Education --      Exclude from Growth Chart --    No data found.  Updated Vital Signs BP (!) 160/82 (BP Location: Left Arm)  Pulse 82   Temp 100.1 F (37.8 C) (Oral)   Resp 16   SpO2 93%   Visual Acuity Right Eye Distance:   Left Eye Distance:   Bilateral Distance:    Right Eye Near:   Left Eye Near:    Bilateral Near:     Physical Exam Vitals and nursing note reviewed.  Constitutional:      Appearance: Normal appearance.  HENT:     Head: Normocephalic and atraumatic.     Right Ear: External ear normal.     Left Ear: External ear normal.     Nose: Congestion and rhinorrhea present.     Mouth/Throat:     Mouth: Mucous membranes are moist.  Eyes:     Conjunctiva/sclera: Conjunctivae normal.  Cardiovascular:     Rate and Rhythm: Normal rate and regular rhythm.     Heart sounds: Normal heart sounds. No murmur heard. Pulmonary:     Effort: Pulmonary effort is normal. No respiratory distress.     Breath sounds: Wheezing present.  Musculoskeletal:        General: Normal range of motion.  Skin:    General: Skin is warm and dry.  Neurological:     General: No focal deficit present.     Mental Status: He is alert.  Psychiatric:        Mood and Affect: Mood normal.      UC Treatments / Results  Labs (all labs ordered are listed, but only abnormal results are displayed) Labs Reviewed  POC COVID19/FLU A&B COMBO    EKG   Radiology No results found.  Procedures Procedures (including critical care time)  Medications Ordered in UC Medications  acetaminophen (TYLENOL) tablet 650 mg (650 mg Oral Given 08/09/23 1508)    Initial Impression / Assessment and Plan / UC Course  I have reviewed the triage vital signs and the  nursing notes.  Pertinent labs & imaging results that were available during my care of the patient were reviewed by me and considered in my medical decision making (see chart for details).  Vitals and triage reviewed, patient is hemodynamically stable.  Able to speak in full sentences, no acute distress.  Congestion and rhinorrhea present on physical exam.  Slight expiratory wheezing noted in middle facets.  Chest x-ray by my interpretation does not show any obvious infiltrate.  POC influenza and COVID testing negative, suspect other viral URI has exacerbated asthma.  No history of type 2 diabetes, will send in steroid burst to treat asthma as well as Tessalon.  Strict emergency and follow-up precautions given if no improvement.  Plan of care, follow-up care return precautions given, no questions at this time.  Radiology interpretation of chest x-ray does not show any acute cardiopulmonary disease, no change in treatment plan.    Final Clinical Impressions(s) / UC Diagnoses   Final diagnoses:  Acute cough  Mild intermittent asthma with acute exacerbation     Discharge Instructions      Your chest x-ray did not show any obvious pneumonia.  I will contact you by phone if the official radiology interpretation is different than mine and alters the treatment plan.  Start the steroids today and then take them daily with breakfast to help treat your acute asthma exacerbation.  Continue to use your albuterol inhaler and neb solutions.  You can take 1200 mg of Mucinex daily to loosen your secretions and use the cough medicine every 8 hours.  Ensure you are staying well-hydrated.  Your  symptoms should improve with the steroids, if no improvement or any changes follow-up with your primary care provider or return to clinic for reevaluation.      ED Prescriptions     Medication Sig Dispense Auth. Provider   predniSONE (DELTASONE) 20 MG tablet Take 2 tablets (40 mg total) by mouth daily for 5 days.  10 tablet Rinaldo Ratel, [place], FNP   benzonatate (TESSALON) 100 MG capsule Take 1 capsule (100 mg total) by mouth every 8 (eight) hours. 21 capsule Overton Boggus, [place] N, [place]      PDMP not reviewed this encounter.   Shabazz Mckey, [place] N, FNP 08/09/23 1615    Kaid Seeberger, [place] N, [place] 08/09/23 [phone]

## 2023-08-09 NOTE — Discharge Instructions (Addendum)
 Your chest x-ray did not show any obvious pneumonia.  I will contact you by phone if the official radiology interpretation is different than mine and alters the treatment plan.  Start the steroids today and then take them daily with breakfast to help treat your acute asthma exacerbation.  Continue to use your albuterol inhaler and neb solutions.  You can take 1200 mg of Mucinex daily to loosen your secretions and use the cough medicine every 8 hours.  Ensure you are staying well-hydrated.  Your symptoms should improve with the steroids, if no improvement or any changes follow-up with your primary care provider or return to clinic for reevaluation.

## 2023-08-09 NOTE — ED Triage Notes (Signed)
 Patient reports that he has had a body aches, a productive cough with green sputum,  x 3 days.  Patient states he vomited x 4 last pm, but none today.  Patient states he has ben taking robitussin and Mucinex

## 2023-08-10 ENCOUNTER — Telehealth: Payer: Self-pay | Admitting: Pharmacist

## 2023-08-10 NOTE — Addendum Note (Signed)
 Addended by: Tylene Fantasia on: 08/10/2023 11:26 AM   Modules accepted: Orders

## 2023-08-10 NOTE — Telephone Encounter (Signed)
 Spoke to patient, he managed to get Praluent covered by [place] and he has 3 months supply. He needs training on how to administer correctly. Scheduled appointment for March 20 at 11:30

## 2023-08-15 ENCOUNTER — Encounter: Payer: Self-pay | Admitting: Internal Medicine

## 2023-08-15 ENCOUNTER — Ambulatory Visit (INDEPENDENT_AMBULATORY_CARE_PROVIDER_SITE_OTHER): Admitting: Internal Medicine

## 2023-08-15 ENCOUNTER — Other Ambulatory Visit: Payer: Self-pay

## 2023-08-15 VITALS — BP 140/86 | HR 86 | Temp 97.2°F | Resp 18 | Ht 70.0 in | Wt 194.1 lb

## 2023-08-15 DIAGNOSIS — J4541 Moderate persistent asthma with (acute) exacerbation: Secondary | ICD-10-CM

## 2023-08-15 DIAGNOSIS — J069 Acute upper respiratory infection, unspecified: Secondary | ICD-10-CM | POA: Diagnosis not present

## 2023-08-15 DIAGNOSIS — J302 Other seasonal allergic rhinitis: Secondary | ICD-10-CM

## 2023-08-15 DIAGNOSIS — J3089 Other allergic rhinitis: Secondary | ICD-10-CM

## 2023-08-15 MED ORDER — CETIRIZINE HCL 10 MG PO TABS
10.0000 mg | ORAL_TABLET | Freq: Every day | ORAL | 5 refills | Status: AC
Start: 2023-08-15 — End: ?

## 2023-08-15 MED ORDER — LEVALBUTEROL HCL 1.25 MG/3ML IN NEBU: 1.2500 mg | INHALATION_SOLUTION | Freq: Once | RESPIRATORY_TRACT | Status: AC

## 2023-08-15 MED ORDER — PREDNISONE 10 MG PO TABS: ORAL_TABLET | ORAL | 0 refills | Status: AC

## 2023-08-15 MED ORDER — ALBUTEROL SULFATE HFA 108 (90 BASE) MCG/ACT IN AERS: 2.0000 | INHALATION_SPRAY | Freq: Four times a day (QID) | RESPIRATORY_TRACT | 1 refills | Status: AC | PRN

## 2023-08-15 MED ORDER — BREZTRI AEROSPHERE 160-9-4.8 MCG/ACT IN AERO: 2.0000 | INHALATION_SPRAY | Freq: Two times a day (BID) | RESPIRATORY_TRACT | 5 refills | Status: AC

## 2023-08-15 MED ORDER — ALBUTEROL SULFATE (2.5 MG/3ML) 0.083% IN NEBU: 2.5000 mg | INHALATION_SOLUTION | Freq: Four times a day (QID) | RESPIRATORY_TRACT | 1 refills | Status: AC | PRN

## 2023-08-15 NOTE — Patient Instructions (Addendum)
 Moderate Persistent Asthma with Acute Exacerbation Viral URI with cough  - Start prednisone 40mg  for 1 day, 20mg  for 2 days, 10mg  for 2 days.  - Maintenance inhaler: start Breztri 160-9-4.56mcg 2 puffs twice daily.   - Rescue inhaler: Albuterol 2 puffs via spacer or 1 vial via nebulizer every 4-6 hours as needed for respiratory symptoms of cough, shortness of breath, or wheezing Asthma control goals:  Full participation in all desired activities (may need albuterol before activity) Albuterol use two times or less a week on average (not counting use with activity) Cough interfering with sleep two times or less a month Oral steroids no more than once a year No hospitalizations   Allergic Rhinitis: - Positive skin test 10/2017: ragweed and dust mites  - Use nasal saline rinses before nose sprays such as with Neilmed Sinus Rinse.  Use distilled water.   - Use Zyrtec 10 mg daily.

## 2023-08-15 NOTE — Progress Notes (Signed)
 FOLLOW UP Date of Service/Encounter:  08/15/23   Subjective:  Alexander Burns (DOB: 08/13/1950) is a 73 y.o. male who [facility] for follow up for an acute visit.   History obtained from: chart review and patient. Last seen by Dr Lucie Leather on 06/22/2021 for uncontrolled asthma, viral URI, other allergic rhinitis, LPR.  Discussed Breztri, Flonase Protonix, Albuterol PRN.    Seen in urgent care on 3/5 for body aches, fatigue, cough, wheezing, SOB, vomiting. Wheezing noted on exam.  CXR unremarkable.  COVID/Flu negative.  Given prednisone 40mg  daily for 5 days.  Prior to this, asthma was doing well without dyspnea/wheezing.  No other ER/urgent care visits. He was  Still having some wheezing and lots of coughing.  Out of Albuterol; awaiting shipment from [place].  Completed course of prednisone on Saturday.  Does not have Breztri or any controller inhaler. Tried using Flonase nose spray with worsening symptoms.  Not taking any anti histamines or using saline rinses.   Past Medical History: Past Medical History:  Diagnosis Date   Asthma    Bladder cancer (HCC) 2018   Coronary artery disease    Hypertension     Objective:  BP (!) 140/86 (BP Location: Left Arm, Patient Position: Sitting, Cuff Size: Normal)   Pulse 86   Temp (!) 97.2 F (36.2 C) (Temporal)   Resp 18   Ht 5\' 10"  (1.778 m)   Wt 194 lb 1.6 oz (88 kg)   SpO2 97%   BMI 27.85 kg/m  Body mass index is 27.85 kg/m. Physical Exam: GEN: alert, well developed HEENT: clear conjunctiva, nose with mild inferior turbinate hypertrophy, pink nasal mucosa, + clear rhinorrhea, + cobblestoning HEART: regular rate and rhythm, no murmur LUNGS: + wheezing in bl lower fields, + cough  SKIN: no rashes or lesions  Spirometry:  Tracings reviewed. His effort: Good reproducible efforts. FVC: 2.84L, 82% predicted; post 3.57L, 102% FEV1: 1.67L, 63% predicted; post 1.88L, 71% FEV1/FVC ratio:  59% Interpretation: Spirometry consistent with mild obstructive disease. Post bronchodilator reversibility present.   Please see scanned spirometry results for details.  Assessment:   1. Viral URI with cough   2. Seasonal and perennial allergic rhinitis   3. Moderate persistent asthma with acute exacerbation     Plan/Recommendations:  Moderate Persistent Asthma with Acute Exacerbation Viral URI with cough  - MDI technique discussed.  Spirometry with obstruction and reversibility after Xopenex nebulizer x1. Exacerbation related to viral URI.  Will restart Breztri, give Xopenex neb x1 in clinic with improvement in wheezing and extend oral prednisone.   Given breztri sample.  - Start prednisone 40mg  for 1 day, 20mg  for 2 days, 10mg  for 2 days.  - Maintenance inhaler: start Breztri 160-9-4.42mcg 2 puffs twice daily.   - Rescue inhaler: Albuterol 2 puffs via spacer or 1 vial via nebulizer every 4-6 hours as needed for respiratory symptoms of cough, shortness of breath, or wheezing Asthma control goals:  Full participation in all desired activities (may need albuterol before activity) Albuterol use two times or less a week on average (not counting use with activity) Cough interfering with sleep two times or less a month Oral steroids no more than once a year No hospitalizations   Allergic Rhinitis: - Positive skin test 10/2017: ragweed and dust mites  - Use nasal saline rinses before nose sprays such as with Neilmed Sinus Rinse.  Use distilled water.   - Use Zyrtec 10 mg daily.  Follow up: 2-3 months.   Alesia Morin, MD [facility]

## 2023-08-24 ENCOUNTER — Encounter: Payer: Self-pay | Admitting: Pharmacist

## 2023-08-24 ENCOUNTER — Ambulatory Visit: Attending: Cardiology | Admitting: Pharmacist

## 2023-08-24 VITALS — BP 152/88 | HR 65

## 2023-08-24 DIAGNOSIS — E782 Mixed hyperlipidemia: Secondary | ICD-10-CM

## 2023-08-24 DIAGNOSIS — I10 Essential (primary) hypertension: Secondary | ICD-10-CM

## 2023-08-24 DIAGNOSIS — E7800 Pure hypercholesterolemia, unspecified: Secondary | ICD-10-CM

## 2023-08-24 NOTE — Assessment & Plan Note (Signed)
 Assessment and Plan:  LDL goal: < 70 mg/dl last LDLc 725 mg/dl (36/64) while not on any lipid lowering agent Intolerance to statins - Crestor 20 mg, and any lower doses, Lipitor - doses not recall doses - they both caused severe myalgia  Follows low salt low fat diet and eats mainly home cooked meals  Received Praluent from [place] [facility]- learn administration steps today and self injected 1st dose today during OV Patient self stopped Zetia thinking Praluent should be enough to lower LDL to goal. However if LDL in future lab remains above goal he is willing to restart Zetia 10 mg daily Follow up lipid due on  May 22

## 2023-08-24 NOTE — Progress Notes (Signed)
 Patient ID: Alexander Burns                 DOB: 1950-10-24                    MRN: [id number]      HPI: Alexander Burns is a 73 y.o. male patient referred to lipid clinic by Dr.Patwardhan. PMH is significant for hypertension, mixed hyperlipidemia, family h/o CAD, s/p mid LAD PCI (05/2021) for unstable angina. Echocardiogram 08/19/2021- EF 60-65%  He is not taking statin due to myalgias and intolerance symptoms in the past-including atorvastatin and rosuvastatin.  patient's BP was elevated Nov OV. Metoprolol Er 50 mg was added to amlodipine 5 mg. Patient presented today reports he can not tolerate metoprolol due to joint pain,SOB and general weakness so he has self adjusted dose to 50 mg 2-3 times per week, home BP ~145-150/78 heart rate high 50's  Patient saw me 06/13/2022 for cholesterol management. Repatha was approved through his insurance but he has [facility] coverage so he got Praluent covered through them at so he can get it at no cost. He wanted to learn safe administration of Praluent so he presented today for follow up. Reports he has stopped taking metoprolol and Zetia. Metoprolol was giving him lots of side effects and his BP is well controlled on amlodipine 5 mg dose. Reports his home BP ~132/80 range his BP is always elevated at doctors office as he has white coat syndrome. He has stopped taking Zetia as he will be starting Praluent. He does not like to take too many medications.     Patient was trained on demo pen first then he self administered his 1st dose under supervision   Current Medications: Praluent 75 mg [place] Q14D  Intolerances: Crestor 20 mg, and any lower doses, Lipitor - doses not recall doses - they both caused severe myalgia  Risk Factors: hypertension, mixed hyperlipidemia, family h/o CAD, s/p mid LAD PCI (05/2021) for unstable angina LDL goal: <70 mg/dl  Last lipid lab: LDLc 725, TC 202, TG 53, HDL 77 (08/2022) Diet: low salt low fat home cooked meals - does not eat out    Exercise: 30 min walks everyday   Family History:  Relation Problem Comments  Mother (Deceased at age 25) Diabetes     Father (Deceased at age 87) Diabetes   Heart disease     Sister Metallurgist) Breast cancer   Diabetes     Sister Metallurgist)   Sister Metallurgist)   Sister Metallurgist)   Brother (Alive) Heart attack (Age: 68) first heart attack    Maternal Grandmother (Deceased)     Labs: Lipid Panel     Component Value Date/Time   CHOL 200 (H) 06/14/2023 1200   TRIG 59 06/14/2023 1200   HDL 63 06/14/2023 1200   CHOLHDL 3.2 06/14/2023 1200   LDLCALC 126 (H) 06/14/2023 1200   LABVLDL 11 06/14/2023 1200    Past Medical History:  Diagnosis Date   Asthma    Bladder cancer (HCC) 2018   Coronary artery disease    Hypertension     Current Outpatient Medications on File Prior to Visit  Medication Sig Dispense Refill   albuterol (PROVENTIL) (2.5 MG/3ML) 0.083% nebulizer solution Take 3 mLs (2.5 mg total) by nebulization every 6 (six) hours as needed for wheezing or shortness of breath. 75 mL 1   albuterol (VENTOLIN HFA) 108 (90 Base) MCG/ACT inhaler Inhale 2 puffs into the lungs every 6 (six) hours  as needed for wheezing or shortness of breath. 18 g 1   Alirocumab (PRALUENT) 75 MG/ML SOAJ Inject 1 mL (75 mg total) into the skin every 14 (fourteen) days.     amLODipine (NORVASC) 5 MG tablet Take 1 tablet (5 mg total) by mouth daily. 90 tablet 3   aspirin EC 81 MG tablet TAKE 1 TABLET BY MOUTH DAILY. SWALLOW WHOLE. 90 tablet 3   benzonatate (TESSALON) 100 MG capsule Take 1 capsule (100 mg total) by mouth every 8 (eight) hours. 21 capsule 0   budeson-glycopyrrolate-formoterol (BREZTRI AEROSPHERE) 160-9-4.8 MCG/ACT AERO Inhale 2 puffs into the lungs in the morning and at bedtime. 10.7 g 5   cetirizine (ZYRTEC ALLERGY) 10 MG tablet Take 1 tablet (10 mg total) by mouth daily. 30 tablet 5   cholecalciferol (VITAMIN D) 1000 UNITS tablet Take 1,000 Units by mouth every morning.      diazepam  (VALIUM) 5 MG tablet Take 1 tablet (5 mg total) by mouth 1 hour prior to MRI for claustrophobia.  May repeat another 1 tablet (5 mg total) if needed. 2 tablet 0   fluticasone (FLONASE) 50 MCG/ACT nasal spray Place 1 spray into both nostrils as needed.     lidocaine (LIDODERM) 5 % Place 1 patch onto the skin daily.     pantoprazole (PROTONIX) 40 MG tablet Take 40 mg by mouth daily.     Current Facility-Administered Medications on File Prior to Visit  Medication Dose Route Frequency Provider Last Rate Last Admin   levalbuterol (XOPENEX) nebulizer solution 1.25 mg  1.25 mg Nebulization Once         Allergies  Allergen Reactions   Rosuvastatin Other (See Comments)   Nabumetone Hives and Rash    Assessment/Plan:  1. Hyperlipidemia -  Problem  Hypercholesterolemia   Current Medications: purulent 75 mg Q14 D Intolerances: Crestor 20 mg, and any lower doses, Lipitor - doses not recall doses - they both caused severe myalgia  Risk Factors: hypertension, mixed hyperlipidemia, family h/o CAD, s/p mid LAD PCI (05/2021) for unstable angina LDL goal: <70 mg/dl  Last lipid lab: LDLc 027, TC 202, TG 53, HDL 77 (08/2022)   Essential Hypertension   Current medications: amlodipine 5 mg daily Previously tried: metoprolol - joint pain, general weakness  BP goal <130/80        Hypercholesterolemia Assessment and Plan:  LDL goal: < 70 mg/dl last LDLc 253 mg/dl (66/44) while not on any lipid lowering agent Intolerance to statins - Crestor 20 mg, and any lower doses, Lipitor - doses not recall doses - they both caused severe myalgia  Follows low salt low fat diet and eats mainly home cooked meals  Received Praluent from [place] [facility]- learn administration steps today and self injected 1st dose today during OV Patient self stopped Zetia thinking Praluent should be enough to lower LDL to goal. However if LDL in future lab remains above goal he is willing to restart Zetia 10 mg daily Follow up lipid due  on  May 22       Essential hypertension Assessment and Plan: BP is uncontrolled in office BP 152/88 mmHg heart rate 65 (goal<130/80) Tolerates amlodipine well but not metoprolol due to joint pain, general weakness Patient has self adjusts medications and he has stropped taking metoprolol  home BP ~132/80 ;reports he has white coat syndrome  Denies SOB at rest , palpitation, chest pain, headaches,or swelling Follows low salt diet  walks 30 min per day Patient does not want to  make any changes to his current medications - Patient to keep record of BP readings with heart rate and report to [place] if it persistently stays above goal    Thank you,  Carmela Hurt, Pharm.D [facility] A Division of [facility] [address] [place], [place] [address]  Phone: [phone]; Fax: [phone]

## 2023-08-24 NOTE — Assessment & Plan Note (Signed)
 Assessment and Plan: BP is uncontrolled in office BP 152/88 mmHg heart rate 65 (goal<130/80) Tolerates amlodipine well but not metoprolol due to joint pain, general weakness Patient has self adjusts medications and he has stropped taking metoprolol  home BP ~132/80 ;reports he has white coat syndrome  Denies SOB at rest , palpitation, chest pain, headaches,or swelling Follows low salt diet  walks 30 min per day Patient does not want to make any changes to his current medications - Patient to keep record of BP readings with heart rate and report to [place] if it persistently stays above goal

## 2023-10-27 ENCOUNTER — Ambulatory Visit: Payer: Self-pay | Admitting: Pharmacist

## 2023-10-27 LAB — LIPID PANEL
Chol/HDL Ratio: 2.3 ratio (ref 0.0–5.0)
Cholesterol, Total: 138 mg/dL (ref 100–199)
HDL: 59 mg/dL (ref >39)
LDL Chol Calc (NIH): 69 mg/dL (ref 0–99)
Triglycerides: 45 mg/dL (ref 0–149)
VLDL Cholesterol Cal: 10 mg/dL (ref 5–40)

## 2023-11-20 NOTE — Telephone Encounter (Signed)
 Patient expresses reluctance to be on lipid-lowering medications long term. Discussed the importance and benefits of these medications for cardiovascular risk reduction.  Patient plans to discuss this further with his PCP at the next appointment and will inform us  of his final decision regarding continuing Praluent.

## 2023-11-27 ENCOUNTER — Emergency Department (HOSPITAL_COMMUNITY)
Admission: EM | Admit: 2023-11-27 | Discharge: 2023-11-27 | Disposition: A | Discharge status: Home / Self Care | Attending: Emergency Medicine | Admitting: Emergency Medicine

## 2023-11-27 ENCOUNTER — Other Ambulatory Visit: Payer: Self-pay

## 2023-11-27 ENCOUNTER — Emergency Department (HOSPITAL_COMMUNITY)

## 2023-11-27 ENCOUNTER — Encounter (HOSPITAL_COMMUNITY): Payer: Self-pay

## 2023-11-27 DIAGNOSIS — Z7982 Long term (current) use of aspirin: Secondary | ICD-10-CM | POA: Insufficient documentation

## 2023-11-27 DIAGNOSIS — Z8551 Personal history of malignant neoplasm of bladder: Secondary | ICD-10-CM | POA: Diagnosis not present

## 2023-11-27 DIAGNOSIS — M545 Low back pain, unspecified: Secondary | ICD-10-CM | POA: Diagnosis present

## 2023-11-27 DIAGNOSIS — M5136 Other intervertebral disc degeneration, lumbar region with discogenic back pain only: Secondary | ICD-10-CM | POA: Diagnosis not present

## 2023-11-27 LAB — URINALYSIS, ROUTINE W REFLEX MICROSCOPIC
Bilirubin Urine: NEGATIVE
Glucose, UA: NEGATIVE mg/dL
Hgb urine dipstick: NEGATIVE
Ketones, ur: NEGATIVE mg/dL
Leukocytes,Ua: NEGATIVE
Nitrite: NEGATIVE
Protein, ur: NEGATIVE mg/dL
Specific Gravity, Urine: 1.003 — ABNORMAL LOW (ref 1.005–1.030)
pH: 7 (ref 5.0–8.0)

## 2023-11-27 MED ORDER — METHOCARBAMOL 500 MG PO TABS: 1000.0000 mg | ORAL_TABLET | Freq: Three times a day (TID) | ORAL | 0 refills | Status: AC | PRN

## 2023-11-27 MED ORDER — PREDNISONE 20 MG PO TABS
ORAL_TABLET | ORAL | 0 refills | Status: DC
Start: 2023-11-27 — End: 2024-04-02

## 2023-11-27 NOTE — Discharge Instructions (Signed)
 Please read and follow all provided instructions.  Your diagnoses today include:  1. Acute midline low back pain without sciatica   2. Degeneration of intervertebral disc of lumbar region with discogenic back pain     Tests performed today include: Vital signs - see below for your results today CT lumbar spine -shows new broad-based disc protrusion at L3-4 resulting in moderate central and bilateral foraminal stenosis  Medications prescribed:  Prednisone  - steroid medicine   It is best to take this medication in the morning to prevent sleeping problems. If you are diabetic, monitor your blood sugar closely and stop taking Prednisone  if blood sugar is over 300. Take with food to prevent stomach upset.   Robaxin (methocarbamol) - muscle relaxer medication  DO NOT drive or perform any activities that require you to be awake and alert because this medicine can make you drowsy.   Take any prescribed medications only as directed.  Home care instructions:  Follow any educational materials contained in this packet Please rest, use ice or heat on your back for the next several days Do not lift, push, pull anything more than 10 pounds for the next week  Follow-up instructions: Please follow-up with your primary care provider in the next 1 week for further evaluation of your symptoms.   Return instructions:  SEEK IMMEDIATE MEDICAL ATTENTION IF YOU HAVE: New numbness, tingling, weakness, or problem with the use of your arms or legs Severe back pain not relieved with medications Loss control of your bowels or bladder Increasing pain in any areas of the body (such as chest or abdominal pain) Shortness of breath, dizziness, or fainting.  Worsening nausea (feeling sick to your stomach), vomiting, fever, or sweats Any other emergent concerns regarding your health   Additional Information:  Your vital signs today were: BP (!) 170/84 (BP Location: Right Arm)   Pulse (!) 54   Temp 98 F  (36.7 C)   Resp 20   Ht 6' (1.829 m)   Wt 89.4 kg   SpO2 100%   BMI 26.72 kg/m  If your blood pressure (BP) was elevated above 135/85 this visit, please have this repeated by your doctor within one month. --------------

## 2023-11-27 NOTE — ED Triage Notes (Signed)
 Pt complaining of lower back pain that started 2 weeks ago and is getting worse. Now it feels like a lot of pressure in the low spine.

## 2023-11-27 NOTE — ED Provider Notes (Signed)
 [place] EMERGENCY DEPARTMENT AT [facility] Provider Note   CSN: [id number] Arrival date & time: 11/27/23  9359     Patient presents with: Back Pain   Alexander Burns is a 73 y.o. male.   Patient with history of bladder cancer, treated, presents to the emergency department for evaluation of worsening low back pain.  Symptoms started about 3 weeks ago.  No injury at onset.  Patient describes pain as a pressure in the back.  He has taken Tylenol  at times which helps, also uses heat on the area which helps temporarily.  Pressure sensation has become worse despite treatment.  He is able to ambulate.  Reports history of back surgery at the L5 level in the past.  No pain, numbness, or tingling radiating to the legs.  No difficulty with urination.  No history of prostate problems.       Prior to Admission medications   Medication Sig Start Date End Date Taking? Authorizing Provider  albuterol  (PROVENTIL ) (2.5 MG/3ML) 0.083% nebulizer solution Take 3 mLs (2.5 mg total) by nebulization every 6 (six) hours as needed for wheezing or shortness of breath. 08/15/23   Tobie Arleta SQUIBB, MD  albuterol  (VENTOLIN  HFA) 108 (90 Base) MCG/ACT inhaler Inhale 2 puffs into the lungs every 6 (six) hours as needed for wheezing or shortness of breath. 08/15/23   Tobie Arleta SQUIBB, MD  Alirocumab (PRALUENT) 75 MG/ML SOAJ Inject 1 mL (75 mg total) into the skin every 14 (fourteen) days. 08/10/23   Patwardhan, Newman PARAS, MD  amLODipine  (NORVASC ) 5 MG tablet Take 1 tablet (5 mg total) by mouth daily. 07/20/23   Patwardhan, Newman PARAS, MD  aspirin  EC 81 MG tablet TAKE 1 TABLET BY MOUTH DAILY. SWALLOW WHOLE. 01/27/23   Patwardhan, Newman PARAS, MD  benzonatate  (TESSALON ) 100 MG capsule Take 1 capsule (100 mg total) by mouth every 8 (eight) hours. 08/09/23   Dreama, [place]  N, FNP  budeson-glycopyrrolate-formoterol (BREZTRI  AEROSPHERE) 160-9-4.8 MCG/ACT AERO Inhale 2 puffs into the lungs in the morning and at bedtime. 08/15/23    Tobie Arleta SQUIBB, MD  cetirizine  (ZYRTEC  ALLERGY) 10 MG tablet Take 1 tablet (10 mg total) by mouth daily. 08/15/23   Tobie Arleta SQUIBB, MD  cholecalciferol  (VITAMIN D) 1000 UNITS tablet Take 1,000 Units by mouth every morning.     [provider]  diazepam  (VALIUM ) 5 MG tablet Take 1 tablet (5 mg total) by mouth 1 hour prior to MRI for claustrophobia.  May repeat another 1 tablet (5 mg total) if needed. 06/19/23   Patwardhan, Newman PARAS, MD  fluticasone  (FLONASE ) 50 MCG/ACT nasal spray Place 1 spray into both nostrils as needed. 07/15/21   [provider]  lidocaine  (LIDODERM ) 5 % Place 1 patch onto the skin daily. 04/24/23   [provider]  pantoprazole  (PROTONIX ) 40 MG tablet Take 40 mg by mouth daily.    [provider]    Allergies: Rosuvastatin  and Nabumetone    Review of Systems  Updated Vital Signs BP (!) 170/84 (BP Location: Right Arm)   Pulse (!) 54   Temp 98 F (36.7 C)   Resp 20   Ht 6' (1.829 m)   Wt 89.4 kg   SpO2 100%   BMI 26.72 kg/m   Physical Exam Vitals and nursing note reviewed.  Constitutional:      Appearance: He is well-developed.  HENT:     Head: Normocephalic and atraumatic.   Eyes:     Conjunctiva/sclera: Conjunctivae normal.  Abdominal:     Palpations: Abdomen is soft.     Tenderness: There is no abdominal tenderness. There is no right CVA tenderness or left CVA tenderness.   Musculoskeletal:        General: Normal range of motion.     Cervical back: Normal range of motion.     Thoracic back: No spasms or tenderness.     Lumbar back: Tenderness present. Normal range of motion.       Back:     Comments: No step-off noted with palpation of spine.    Skin:    General: Skin is warm and dry.   Neurological:     Mental Status: He is alert.     Sensory: No sensory deficit.     Motor: No abnormal muscle tone.     Comments: 5/5 strength in entire lower extremities bilaterally. No sensation deficit.   Psychiatric:         Mood and Affect: Mood normal.    ED Course  Patient seen and examined. History obtained directly from patient.   Labs/EKG: Ordered UA  Imaging: Ordered CT lumbar  Medications/Fluids: None ordered  Most recent vital signs reviewed and are as follows: BP (!) 170/84 (BP Location: Right Arm)   Pulse (!) 54   Temp 98 F (36.7 C)   Resp 20   Ht 6' (1.829 m)   Wt 89.4 kg   SpO2 100%   BMI 26.72 kg/m   Initial impression: Low back pain without obvious cause and patient with history of bladder cancer.  Given these red flags, we will perform CT lumbar spine.  8:58 AM Reassessment performed. Patient appears stable.  Discussed with Dr. Doretha.  Labs personally reviewed and interpreted including: UA without compelling signs of infection or blood  Imaging personally visualized and interpreted including: CT lumbar spine shows broad-based disc bulge L3-4 new from 2016, otherwise chronic appearing changes.   Reviewed pertinent lab work and imaging with patient at bedside. Questions answered.   Most current vital signs reviewed and are as follows: BP (!) 170/84 (BP Location: Right Arm)   Pulse (!) 54   Temp 98 F (36.7 C)   Resp 20   Ht 6' (1.829 m)   Wt 89.4 kg   SpO2 100%   BMI 26.72 kg/m   Plan: Discharge to home.   Prescriptions written for: Prednisone , methocarbamol  Patient counseled on proper use of muscle relaxant medication.  They were told not to drink alcohol, drive any vehicle, or do any dangerous activities while taking this medication.  Patient verbalized understanding.  Patient urged to follow-up with PCP/nsg if pain does not improve with treatment and rest or if pain becomes recurrent. [facility] Urged to return with worsening severe pain, loss of bowel or bladder control, trouble walking.   The patient verbalizes understanding and agrees with the plan.   (all labs ordered are listed, but only abnormal results are  displayed) Labs Reviewed - No data to display  EKG: None  Radiology: CT Lumbar Spine Wo Contrast Result Date: 11/27/2023 EXAM: CT OF THE LUMBAR SPINE WITHOUT CONTRAST 11/27/2023 07:51:37 AM TECHNIQUE: CT of the lumbar spine was performed without the administration of intravenous contrast. Multiplanar reformatted images are provided for review. Automated exposure control, iterative reconstruction, and/or weight based adjustment of the mA/kV was utilized to reduce the radiation dose to as low as reasonably achievable. COMPARISON: MRI of the lumbar spine 03/24/2015. CLINICAL HISTORY: Low back pain,  increased fracture risk; worsening low back pain x 3 weeks, no injury, h/o bladder cancer. FINDINGS: BONES AND ALIGNMENT: Normal vertebral body heights. No acute fracture or suspicious bone lesion. Slight degenerative anterolisthesis is present at L3-4. DEGENERATIVE CHANGES: L1-2: Moderate facet hypertrophy is present on the right without focal stenosis. L2-3: Mild disc bulging and bilateral facet hypertrophy are present. No focal stenosis is present. L3-4: A broad-based disc protrusion, moderate facet hypertrophy, and ligamentum flavum thickening at this level are new, resulting in moderate central and bilateral foraminal stenosis. L4-5: A broad-based disc protrusion is present. Moderate facet hypertrophy is present. Moderate foraminal narrowing is worse on the right. L5-S1: A shallow central disc protrusion is present. Moderate facet hypertrophy has progressed. Mild foraminal narrowing is present involving the right. SOFT TISSUES: No acute abnormality. IMPRESSION: 1. New broad-based disc protrusion, moderate facet hypertrophy, and ligamentum flavum thickening at L3-4, resulting in moderate central and bilateral foraminal stenosis. 2. Moderate foraminal narrowing at L4-5, worse on the right. 3. Mild foraminal narrowing at L5-S1, involving the right. Electronically signed by: Lonni Necessary MD 11/27/2023 08:01  AM EDT RP Workstation: [id number]     Procedures   Medications Ordered in the ED - No data to display                                  Medical Decision Making Amount and/or Complexity of Data Reviewed Radiology: ordered.   Patient with back pain progressive over about 3 weeks, . No neurological deficits. Patient is ambulatory. No warning symptoms of back pain including: fecal incontinence, urinary retention or overflow incontinence, night sweats, waking from sleep with back pain, unexplained fevers or weight loss, IVDU, recent trauma.  CT imaging suspicious for spinal stenosis, L3-4 level.  No concern for cauda equina, epidural abscess, or other emergent cause of back pain. Conservative measures such as rest, ice/heat and pain medicine indicated with PCP/neurosurgery follow-up if no improvement with conservative management.       Final diagnoses:  Acute midline low back pain without sciatica  Degeneration of intervertebral disc of lumbar region with discogenic back pain    ED Discharge Orders          Ordered    predniSONE  (DELTASONE ) 20 MG tablet        11/27/23 0855    methocarbamol (ROBAXIN) 500 MG tablet  Every 8 hours PRN        11/27/23 0855               Desiderio Chew, PA-C 11/27/23 0900    Doretha Folks, MD 11/27/23 1541

## 2024-04-02 ENCOUNTER — Ambulatory Visit: Attending: Cardiology | Admitting: Cardiology

## 2024-04-02 ENCOUNTER — Encounter: Payer: Self-pay | Admitting: Cardiology

## 2024-04-02 VITALS — BP 154/80 | HR 55 | Ht 72.0 in | Wt 197.0 lb

## 2024-04-02 DIAGNOSIS — R0609 Other forms of dyspnea: Secondary | ICD-10-CM

## 2024-04-02 DIAGNOSIS — E782 Mixed hyperlipidemia: Secondary | ICD-10-CM | POA: Diagnosis not present

## 2024-04-02 DIAGNOSIS — I10 Essential (primary) hypertension: Secondary | ICD-10-CM | POA: Diagnosis not present

## 2024-04-02 DIAGNOSIS — I25118 Atherosclerotic heart disease of native coronary artery with other forms of angina pectoris: Secondary | ICD-10-CM

## 2024-04-02 NOTE — Patient Instructions (Signed)
  Lab Work: PROBNP  If you have labs (blood work) drawn today and your tests are completely normal, you will receive your results only by: MyChart Message (if you have MyChart) OR A paper copy in the mail If you have any lab test that is abnormal or we need to change your treatment, we will call you to review the results.  Testing/Procedures: [facility]  Your physician has requested that you have a lexiscan myoview. For further information please visit [email or web address] Please follow instruction sheet, as given.   ECHOCARDIOGRAM  Your physician has requested that you have an echocardiogram. Echocardiography is a painless test that uses sound waves to create images of your heart. It provides your doctor with information about the size and shape of your heart and how well your heart's chambers and valves are working. This procedure takes approximately one hour. There are no restrictions for this procedure. Please do NOT wear cologne, perfume, aftershave, or lotions (deodorant is allowed). Please arrive 15 minutes prior to your appointment time.  Please note: We ask at that you not bring children with you during ultrasound (echo/ vascular) testing. Due to room size and safety concerns, children are not allowed in the ultrasound rooms during exams. Our front office staff cannot provide observation of children in our lobby area while testing is being conducted. An adult accompanying a patient to their appointment will only be allowed in the ultrasound room at the discretion of the ultrasound technician under special circumstances. We apologize for any inconvenience.   Follow-Up: At [facility], you and your health needs are our priority.  As part of our continuing mission to provide you with exceptional heart care, our providers are all part of one team.  This team includes your primary Cardiologist (physician) and Advanced Practice Providers or APPs (Physician Assistants and Nurse  Practitioners) who all work together to provide you with the care you need, when you need it.  Your next appointment:   8 week(s)  Provider:   One of our Advanced Practice Providers (APPs) OR Dr. [place]: Morse Clause, PA-C  Lamarr Satterfield, NP Miriam Shams, NP  Olivia Pavy, PA-C Josefa Beauvais, NP  Leontine Salen, PA-C Orren Fabry, PA-C  [place], PA-C Ernest Dick, NP  Damien Braver, NP Jon Hails, PA-C  Waddell Donath, PA-C    Dayna Dunn, PA-C  Scott Weaver, PA-C Lum Louis, NP Katlyn West, NP Callie Goodrich, PA-C  Xika Zhao, NP Sheng Haley, PA-C    Kathleen Johnson, PA-C

## 2024-04-02 NOTE — Progress Notes (Signed)
 Cardiology Office Note:  .   Date:  04/02/2024  ID:  Alexander Burns, DOB [birth year], MRN [id number] PCP: Larnell Hamilton, MD  [facility] Providers Cardiologist:  Newman Lawrence, MD PCP: Larnell Hamilton, MD  Chief Complaint  Patient presents with   Coronary Artery Disease   Hypertension   Exertional dyspnea      History of Present Illness: .    Alexander Burns is a 73 y.o. male with hypertension, mixed hyperlipidemia, CAD, apical HCM  Patient has not had any chest pain recently, but has noticed increased dyspnea on exertion.  Blood pressure is very well-controlled at home, but is often elevated during office visits.  He is compliant with his medical therapy, but is not fond of needles.  Vitals:   04/02/24 0909  BP: (!) 154/80  Pulse: (!) 55  SpO2: 97%      ROS:  Review of Systems  Cardiovascular:  Negative for chest pain, dyspnea on exertion, leg swelling, palpitations and syncope.  Musculoskeletal:  Positive for myalgias.     Studies Reviewed: SABRA       EKG 04/02/2024: Sinus bradycardia 55 bpm Possible Left atrial enlargement Left ventricular hypertrophy ( Sokolow-Lyon , Romhilt-Estes ) Marked ST abnormality, possible inferior subendocardial injury When compared with ECG of 28-Apr-2023 10:44, No significant change was found     Cardiac MRI 07/2023: 1. 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 LGE burden 5% and in a basal, circumflex distribution. No apical aneurysm, or thrombus. Maximal hypertrophy 17 mm. No high risk features.    Echocardiogram 2023:  Normal LV systolic function with visual EF 60-65%. Left ventricle cavity  is normal in size. Moderate left ventricular hypertrophy. Normal global  wall motion. Indeterminate diastolic filling pattern, normal LAP.  Left atrial cavity is mildly dilated.  Trace tricuspid regurgitation. No pulmonary hypertension. RVSP measures 24  mmHg.  Compared to study 05/06/2021:  Grade 2 DD is now indeterminate, moderate MR  is now resolved, mild/moderate TR is now trace, otherwise no significant  change.   Coronary intervention 2022: LM: Normal LAD: Mid LAD 75-80% stenoses. RFR 0.89 Lcx: No significant disease RCA: No significant disease   Successful percutaneous coronary intervention mid LAD (non-overlapping stents)        PTCA and stent placement 3.5 X 20 mm Synergy drug-eluting stent, post dilatation with 3.75 mm [place]        PTCA and stent placement 4.0 X 16 mm Synergy drug-eluting stent, post dilatation with 4.0 mm    Post PCI RFR 0.93  Labs 10/2023: Chol 138, TG 45, HDL 59, LDL 69  06/2023: Chol 200, TG 56, HDL 64, LDL 126 06/2023: Cr 0.9      Physical Exam:   Physical Exam Vitals and nursing note reviewed.  Constitutional:      General: He is not in acute distress. Neck:     Vascular: No JVD.  Cardiovascular:     Rate and Rhythm: Normal rate and regular rhythm.     Heart sounds: Normal heart sounds. No murmur heard. Pulmonary:     Effort: Pulmonary effort is normal.     Breath sounds: Normal breath sounds. No wheezing or rales.  Musculoskeletal:     Right lower leg: No edema.     Left lower leg: No edema.      VISIT DIAGNOSES:   ICD-10-CM   1. Coronary artery disease involving native coronary artery of native heart with other form of angina pectoris  I25.118 EKG 12-Lead  ECHOCARDIOGRAM COMPLETE    MYOCARDIAL PERFUSION IMAGING    Pro b natriuretic peptide (BNP)    CANCELED: Pro b natriuretic peptide (BNP)    2. Mixed hyperlipidemia  E78.2 EKG 12-Lead    3. Exertional dyspnea  R06.09 Pro b natriuretic peptide (BNP)    CANCELED: Pro b natriuretic peptide (BNP)    4. Essential hypertension  I10         ASSESSMENT AND PLAN: .    Alexander Burns is a 73 y.o. male with hypertension, mixed hyperlipidemia, CAD, apical HCM   CAD: S/p PCI to mid LAD 05/06/2021. Continue aspirin  81 mg daily.  Unable to tolerate statin due  to myalgias-including atorvastatin  and rosuvastatin . Currently on Praluent, but says that he is not fond of needles. Will check with lipid clinic if Leqvio would be approved by insurance.  With recent increase in exertional dyspnea, will obtain echocardiogram and Lexiscan nuclear stress test.  Hypertension: Generally well-controlled at home, suspect competent of whitecoat hypertension. Continue amlodipine  5 mg daily. He is not tolerating metoprolol  due to side effects.    Apical HCM: Low risk.  Continue current medications.     Informed Consent   Shared Decision Making/Informed Consent The risks [chest pain, shortness of breath, cardiac arrhythmias, dizziness, blood pressure fluctuations, myocardial infarction, stroke/transient ischemic attack, nausea, vomiting, allergic reaction, radiation exposure, metallic taste sensation and life-threatening complications (estimated to be 1 in 10,000)], benefits (risk stratification, diagnosing coronary artery disease, treatment guidance) and alternatives of a nuclear stress test were discussed in detail with Mr. Hyde and he agrees to proceed.       F/u in 6 months  Signed, Newman JINNY Lawrence, MD

## 2024-04-03 ENCOUNTER — Other Ambulatory Visit: Payer: Self-pay | Admitting: Urology

## 2024-04-03 ENCOUNTER — Ambulatory Visit: Payer: Self-pay | Admitting: Cardiology

## 2024-04-03 DIAGNOSIS — R972 Elevated prostate specific antigen [PSA]: Secondary | ICD-10-CM

## 2024-04-03 LAB — PRO B NATRIURETIC PEPTIDE: NT-Pro BNP: 107 pg/mL (ref 0–376)

## 2024-04-15 ENCOUNTER — Telehealth: Payer: Self-pay | Admitting: Pharmacist

## 2024-04-15 NOTE — Telephone Encounter (Signed)
-----   Message from [facility] sent at 04/02/2024 11:44 AM EDT ----- He does not want to take injections every 2 weeks.  Reviewed thing insurance would approve Leqvio for him?  Thanks MJP

## 2024-04-15 NOTE — Telephone Encounter (Signed)
 Spoke to patient, given [employer] coverage patient will talk to [place] provider at the next visit later this month. If covered will reach out to us .

## 2024-04-16 NOTE — Telephone Encounter (Signed)
Thanks MJP  

## 2024-04-22 ENCOUNTER — Ambulatory Visit: Admitting: Cardiology

## 2024-05-07 ENCOUNTER — Encounter: Payer: Self-pay | Admitting: Pharmacist

## 2024-05-07 NOTE — Telephone Encounter (Signed)
 Called patient to follow up on Leqvio coverage through [place]. No answer; left voicemail.

## 2024-05-07 NOTE — Telephone Encounter (Signed)
 error

## 2024-05-09 ENCOUNTER — Telehealth (HOSPITAL_COMMUNITY): Payer: Self-pay | Admitting: *Deleted

## 2024-05-09 NOTE — Telephone Encounter (Signed)
 Spoke to patient as a reminder about his STRESS TEST on 05/17/24 at 8:00.

## 2024-05-14 ENCOUNTER — Other Ambulatory Visit: Payer: Self-pay | Admitting: Cardiology

## 2024-05-14 DIAGNOSIS — I25118 Atherosclerotic heart disease of native coronary artery with other forms of angina pectoris: Secondary | ICD-10-CM

## 2024-05-17 ENCOUNTER — Ambulatory Visit (HOSPITAL_COMMUNITY): Admission: RE | Admit: 2024-05-17 | Discharge: 2024-05-17 | Attending: Cardiology

## 2024-05-17 DIAGNOSIS — I25118 Atherosclerotic heart disease of native coronary artery with other forms of angina pectoris: Secondary | ICD-10-CM | POA: Diagnosis present

## 2024-05-17 LAB — MYOCARDIAL PERFUSION IMAGING
LV dias vol: 53 mL (ref 62–150)
LV sys vol: 10 mL (ref 4.2–5.8)
Nuc Stress EF: 64 %
Peak HR: 90 {beats}/min
Rest HR: 48 {beats}/min
Rest Nuclear Isotope Dose: 10.7 mCi
SDS: 0
SRS: 4
SSS: 0
Stress Nuclear Isotope Dose: 30.9 mCi
TID: 1.16

## 2024-05-17 LAB — ECHOCARDIOGRAM COMPLETE
Area-P 1/2: 4.99 cm2
S' Lateral: 3.46 cm

## 2024-05-17 MED ORDER — TECHNETIUM TC 99M TETROFOSMIN IV KIT
10.7000 | PACK | Freq: Once | INTRAVENOUS | Status: AC | PRN
  Administered 2024-05-17: 10.7 via INTRAVENOUS

## 2024-05-17 MED ORDER — TECHNETIUM TC 99M TETROFOSMIN IV KIT
30.9000 | PACK | Freq: Once | INTRAVENOUS | Status: AC | PRN
  Administered 2024-05-17: 30.9 via INTRAVENOUS

## 2024-05-17 MED ORDER — REGADENOSON 0.4 MG/5ML IV SOLN
0.4000 mg | Freq: Once | INTRAVENOUS | Status: AC
  Administered 2024-05-17: 0.4 mg via INTRAVENOUS

## 2024-05-17 MED ORDER — REGADENOSON 0.4 MG/5ML IV SOLN
INTRAVENOUS | Status: AC
  Filled 2024-05-17: qty 5

## 2024-06-12 NOTE — Telephone Encounter (Signed)
 Spoke with the patient regarding Leqvio. The patient stated they do not wish to take any injections for cholesterol management.

## 2024-06-13 ENCOUNTER — Ambulatory Visit: Attending: Cardiology | Admitting: Cardiology

## 2024-06-13 ENCOUNTER — Encounter: Payer: Self-pay | Admitting: Cardiology

## 2024-06-13 VITALS — BP 157/65 | HR 67 | Ht 73.0 in | Wt 200.4 lb

## 2024-06-13 DIAGNOSIS — Z789 Other specified health status: Secondary | ICD-10-CM | POA: Insufficient documentation

## 2024-06-13 DIAGNOSIS — I422 Other hypertrophic cardiomyopathy: Secondary | ICD-10-CM | POA: Diagnosis not present

## 2024-06-13 DIAGNOSIS — I2510 Atherosclerotic heart disease of native coronary artery without angina pectoris: Secondary | ICD-10-CM | POA: Diagnosis not present

## 2024-06-13 DIAGNOSIS — E782 Mixed hyperlipidemia: Secondary | ICD-10-CM

## 2024-06-13 DIAGNOSIS — I517 Cardiomegaly: Secondary | ICD-10-CM | POA: Diagnosis not present

## 2024-06-13 NOTE — Patient Instructions (Signed)
 Follow-Up: At [facility], you and your health needs are our priority.  As part of our continuing mission to provide you with exceptional heart care, our providers are all part of one team.  This team includes your primary Cardiologist (physician) and Advanced Practice Providers or APPs (Physician Assistants and Nurse Practitioners) who all work together to provide you with the care you need, when you need it.  Your next appointment:   1 year(s)  Provider:   Cody Das, MD

## 2024-06-13 NOTE — Progress Notes (Signed)
 " Cardiology Office Note:  .   Date:  06/13/2024  ID:  Alexander Burns, DOB 20-Mar-1951, MRN [id number] PCP: Larnell Hamilton, MD  [facility] Providers Cardiologist:  Newman Lawrence, MD PCP: Larnell Hamilton, MD  Chief Complaint  Patient presents with   Coronary Artery Disease      History of Present Illness: .    Alexander Burns is a 74 y.o. male with hypertension, mixed hyperlipidemia, CAD, apical HCM  Patient is doing well, has not had any recurrent chest pain or shortness of breath symptoms.  Reviewed recent echocardiogram and stress test results with the patient, details below.  Blood pressure is elevated, but reportedly lower at home.  He is of PCSK9 inhibitors, and does not want to start again.  Vitals:   06/13/24 1415  BP: (!) 157/65  Pulse: 67  SpO2: 99%      ROS:  Review of Systems  Cardiovascular:  Negative for chest pain, dyspnea on exertion, leg swelling, palpitations and syncope.  Musculoskeletal:  Positive for myalgias.     Studies Reviewed: SABRA       EKG 04/02/2024: Sinus bradycardia 55 bpm Possible Left atrial enlargement Left ventricular hypertrophy ( Sokolow-Lyon , Romhilt-Estes ) Marked ST abnormality, possible inferior subendocardial injury When compared with ECG of 28-Apr-2023 10:44, No significant change was found    Echocardiogram 05/2024:  1. Apical windows foreshortened. LV apex with significant hypertrophy. .  Left ventricular ejection fraction, by estimation, is 65 to 70%. The left  ventricle has normal function. The left ventricle has no regional wall  motion abnormalities. There is moderate left ventricular hypertrophy. Left ventricular diastolic parameters are consistent with Grade II diastolic dysfunction (pseudonormalization). The average left ventricular global longitudinal strain is -22.6 %. The global longitudinal strain is normal.   2. Right ventricular systolic function is normal. The right ventricular  size is normal.    3. Left atrial size was moderately dilated.   4. The mitral valve is normal in structure. Mild mitral valve  regurgitation.   5. The aortic valve is tricuspid. Aortic valve regurgitation is not  visualized.   Stress test 05/2024:   The study is normal. The study is low risk.   A pharmacological stress test was performed using IV Lexiscan  0.4mg  over 10 seconds performed without concurrent submaximal exercise. Maximum HR of 90 bpm. The patient reported dyspnea during the stress test.   ST depression was noted. There were no arrhythmias during stress. There were no arrhythmias during recovery. ECG was uninterpretable. The ECG was not diagnostic due to pharmacologic protocol.   LV perfusion is normal. There is no evidence of ischemia. There is no evidence of infarction.   Left ventricular function is normal. End diastolic cavity size is normal.   CT images were obtained for attenuation correction and were examined for the presence of coronary calcium  when appropriate.   Coronary calcium  assessment not performed due to prior revascularization.   Prior study not available for comparison.    Cardiac MRI 07/2023: 1. 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 LGE burden 5% and in a basal, circumflex distribution. No apical aneurysm, or thrombus. Maximal hypertrophy 17 mm. No high risk features.    Coronary intervention 2022: LM: Normal LAD: Mid LAD 75-80% stenoses. RFR 0.89 Lcx: No significant disease RCA: No significant disease   Successful percutaneous coronary intervention mid LAD (non-overlapping stents)        PTCA and stent placement 3.5 X 20 mm Synergy drug-eluting stent,  post dilatation with 3.75 mm [place]        PTCA and stent placement 4.0 X 16 mm Synergy drug-eluting stent, post dilatation with 4.0 mm [place]   Post PCI RFR 0.93  Labs 10/2023: Chol 138, TG 45, HDL 59, LDL 69  06/2023: Chol 200, TG 56, HDL 64, LDL 126 06/2023: Cr 0.9      Physical  Exam:   Physical Exam Vitals and nursing note reviewed.  Constitutional:      General: He is not in acute distress. Neck:     Vascular: No JVD.  Cardiovascular:     Rate and Rhythm: Normal rate and regular rhythm.     Heart sounds: Normal heart sounds. No murmur heard. Pulmonary:     Effort: Pulmonary effort is normal.     Breath sounds: Normal breath sounds. No wheezing or rales.  Musculoskeletal:     Right lower leg: No edema.     Left lower leg: No edema.      VISIT DIAGNOSES:   ICD-10-CM   1. Coronary artery disease involving native coronary artery of native heart without angina pectoris  I25.10     2. LVH (left ventricular hypertrophy)  I51.7     3. Apical variant hypertrophic cardiomyopathy (HCC)  I42.2     4. Mixed hyperlipidemia  E78.2     5. Statin intolerance  Z78.9          ASSESSMENT AND PLAN: .    Alexander Burns is a 74 y.o. male with hypertension, mixed hyperlipidemia, CAD, apical HCM   CAD: S/p PCI to mid LAD 05/06/2021. Continue aspirin  81 mg daily.  Unable to tolerate statin due to myalgias-including atorvastatin  and rosuvastatin . Stop Praluent as he does not want to use needles.  Also not interested in considering Leqvio or alternative oral therapy. Understands risk/benefits of not being on lipid-lowering therapy in the setting of known CAD. Will continue to follow-up lipid panel with PCP.  Hypertension: Generally well-controlled at home, suspect competent of whitecoat hypertension. Continue amlodipine  5 mg daily. He is not tolerating metoprolol  due to side effects.    Apical HCM: Low risk.  Continue current medications.     F/u in 1 year   Signed, Newman JINNY Lawrence, MD  "

## 2024-06-14 ENCOUNTER — Ambulatory Visit
Admission: RE | Admit: 2024-06-14 | Discharge: 2024-06-14 | Disposition: A | Source: Ambulatory Visit | Attending: Urology

## 2024-06-14 DIAGNOSIS — R972 Elevated prostate specific antigen [PSA]: Secondary | ICD-10-CM

## 2024-06-14 MED ORDER — GADOPICLENOL 0.5 MMOL/ML IV SOLN
9.0000 mL | Freq: Once | INTRAVENOUS | Status: AC | PRN
  Administered 2024-06-14: 9 mL via INTRAVENOUS

## 2024-06-14 NOTE — Telephone Encounter (Signed)
 Noted.  Thanks MJP

## 2024-07-12 ENCOUNTER — Telehealth: Payer: Self-pay

## 2024-07-12 NOTE — Telephone Encounter (Signed)
"  ° °  Pre-operative Risk Assessment    Patient Name: Alexander Burns  DOB: [birth year] MRN: [id number]   Date of last office visit: 06/14/23 Date of next office visit: Not scheduled   Request for Surgical Clearance    Procedure:  Biopsy  Date of Surgery:  Clearance TBD                                Surgeon:  Dr. Shane Sensing Surgeon's Group or Practice Name:  [facility] Phone number:  [phone] Fax number:  [phone]   Type of Clearance Requested:   - Medical  - Pharmacy:  Hold Aspirin  x5 days prior   Type of Anesthesia:  Not Indicated   Additional requests/questions:    Bonney Ival LOISE Gerome   07/12/2024, 5:02 PM   "
